# Patient Record
Sex: Female | Born: 1975
Health system: Southern US, Community
[De-identification: ages and names within clinical notes are randomized; demographics above are authoritative.]

## PROBLEM LIST (undated history)

## (undated) DIAGNOSIS — R3915 Urgency of urination: Secondary | ICD-10-CM

## (undated) DIAGNOSIS — R0981 Nasal congestion: Secondary | ICD-10-CM

## (undated) DIAGNOSIS — Z803 Family history of malignant neoplasm of breast: Secondary | ICD-10-CM

## (undated) DIAGNOSIS — R0982 Postnasal drip: Secondary | ICD-10-CM

## (undated) DIAGNOSIS — C50411 Malignant neoplasm of upper-outer quadrant of right female breast: Secondary | ICD-10-CM

## (undated) DIAGNOSIS — F419 Anxiety disorder, unspecified: Secondary | ICD-10-CM

## (undated) HISTORY — PX: GYNECOLOGIC CRYOSURGERY: SHX857

## (undated) HISTORY — DX: Family history of malignant neoplasm of breast: Z80.3

## (undated) HISTORY — PX: WISDOM TOOTH EXTRACTION: SHX21

## (undated) HISTORY — DX: Anxiety disorder, unspecified: F41.9

## (undated) HISTORY — DX: Malignant neoplasm of upper-outer quadrant of right female breast: C50.411

## (undated) HISTORY — PX: LEEP: SHX91

---

## 2010-09-26 ENCOUNTER — Inpatient Hospital Stay (HOSPITAL_COMMUNITY)
Admission: AD | Admit: 2010-09-26 | Discharge: 2010-09-26 | Disposition: A | Discharge status: Home / Self Care | Payer: Medicaid Other | Source: Ambulatory Visit | Attending: Obstetrics & Gynecology | Admitting: Obstetrics & Gynecology

## 2010-09-26 ENCOUNTER — Encounter (HOSPITAL_COMMUNITY): Payer: Self-pay | Admitting: *Deleted

## 2010-09-26 DIAGNOSIS — R42 Dizziness and giddiness: Secondary | ICD-10-CM | POA: Insufficient documentation

## 2010-09-26 DIAGNOSIS — R55 Syncope and collapse: Secondary | ICD-10-CM

## 2010-09-26 DIAGNOSIS — O99891 Other specified diseases and conditions complicating pregnancy: Secondary | ICD-10-CM | POA: Insufficient documentation

## 2010-09-26 LAB — URINALYSIS, ROUTINE W REFLEX MICROSCOPIC
Glucose, UA: NEGATIVE mg/dL
Leukocytes, UA: NEGATIVE
Protein, ur: 30 mg/dL — AB
Urobilinogen, UA: 0.2 mg/dL (ref 0.0–1.0)

## 2010-09-26 LAB — URINE MICROSCOPIC-ADD ON

## 2010-09-26 NOTE — Progress Notes (Signed)
Pt in via ems for weakness, lightheadedness, and dizziness.  States the episode occurred around 1045.  States dizziness has been occurring throughout pregnancy but has gotten more frequent past 3 weeks.  Was seen in office Friday, told MD, had blood drawn.  Denies any bleeding, discharge, or leaking of fluid.  + FM.  Presently feels tired and weak.

## 2010-09-26 NOTE — ED Provider Notes (Addendum)
History   Pt presents today via EMS c/o feeling like she was going to pass out. She states she got out of her car and walked to class and sat down. At that time she felt very hot, began to sweat, and felt like she was going to pass out. She was able to get to the floor where she began to feel better but she called EMS so that she could be evaluated. She denies lower abd pain, vag dc, bleeding, fever, or any other sx at this time.  Chief Complaint  Patient presents with  . Fatigue    dizziness and lightheaded   HPI  OB History    Grav Para Term Preterm Abortions TAB SAB Ect Mult Living   2 1 1  0 0 0 0 0 0 1      Past Medical History  Diagnosis Date  . No pertinent past medical history     Past Surgical History  Procedure Date  . Wisdom tooth extraction   . Leep   . Gynecologic cryosurgery     No family history on file.  History  Substance Use Topics  . Smoking status: Never Smoker   . Smokeless tobacco: Not on file  . Alcohol Use: No    Allergies: No Known Allergies  Prescriptions prior to admission  Medication Sig Dispense Refill  . acetaminophen (TYLENOL) 500 MG tablet Take 1,000 mg by mouth every 6 (six) hours as needed. pain       . prenatal vitamin w/FE, FA (PRENATAL 1 + 1) 27-1 MG TABS Take 1 tablet by mouth daily.          Review of Systems  Constitutional: Negative for fever.  Eyes: Negative for double vision and photophobia.  Cardiovascular: Negative for chest pain and palpitations.  Gastrointestinal: Negative for nausea, vomiting, abdominal pain, diarrhea and constipation.  Genitourinary: Negative for dysuria, urgency, frequency and hematuria.  Neurological: Negative for headaches.  Psychiatric/Behavioral: Negative for depression and suicidal ideas.   Physical Exam   Blood pressure 122/79, pulse 83, temperature 97.1 F (36.2 C), temperature source Oral, resp. rate 18, height 5\' 2"  (1.575 m), weight 121 lb (54.885 kg), SpO2 95.00%.  Physical Exam    Constitutional: She appears well-developed and well-nourished. No distress.  HENT:  Head: Normocephalic and atraumatic.  Eyes: EOM are normal. Pupils are equal, round, and reactive to light.  Cardiovascular: Normal rate, regular rhythm and normal heart sounds.  Exam reveals no gallop and no friction rub.   No murmur heard. Respiratory: Effort normal and breath sounds normal. No respiratory distress. She has no wheezes. She has no rales. She exhibits no tenderness.  GI: Soft. She exhibits no distension. There is no tenderness. There is no rebound and no guarding.  Neurological: She is alert.  Skin: Skin is warm and dry. She is not diaphoretic.  Psychiatric: She has a normal mood and affect. Her behavior is normal. Judgment and thought content normal.    MAU Course  Procedures NST reactive for 29wks.  Discussed pt with Dr. Arlyce Dice. Dr. Arlyce Dice advised to give pt precautions and have her f/u in the office.  Assessment and Plan  Vasovagal reaction: discussed with pt at length. Discussed diet, activity, risks, and precautions. She has f/u scheduled.  Clinton Gallant. Sheriden Archibeque III, DrHSc, MPAS, PA-C  09/26/2010, 1:01 PM   Henrietta Hoover, PA 09/26/10 1316  [place], [place] 09/26/10 1316

## 2010-10-02 NOTE — ED Provider Notes (Signed)
Reviewed with EDP.

## 2010-11-24 ENCOUNTER — Ambulatory Visit: Payer: Self-pay | Admitting: Pediatrics

## 2010-11-25 ENCOUNTER — Inpatient Hospital Stay (HOSPITAL_COMMUNITY)
Admission: AD | Admit: 2010-11-25 | Discharge: 2010-11-26 | Disposition: A | Discharge status: Home / Self Care | Payer: Medicaid Other | Source: Ambulatory Visit | Attending: Obstetrics and Gynecology | Admitting: Obstetrics and Gynecology

## 2010-11-25 ENCOUNTER — Encounter (HOSPITAL_COMMUNITY): Payer: Self-pay

## 2010-11-25 DIAGNOSIS — O479 False labor, unspecified: Secondary | ICD-10-CM | POA: Insufficient documentation

## 2010-11-25 NOTE — Progress Notes (Signed)
Patient is here for labor check. States that ctx q61m. Reports good fetal movement. Denies any vaginal bleeding or lof.

## 2010-11-25 NOTE — Progress Notes (Signed)
Dr Claiborne Billings notified of patient, her c/o strong contractions, tracing, ctx pattern, sve result. Order to have her ambulate if she desires and recheck.

## 2010-11-26 ENCOUNTER — Encounter (HOSPITAL_COMMUNITY): Payer: Self-pay

## 2010-11-26 ENCOUNTER — Inpatient Hospital Stay (HOSPITAL_COMMUNITY)
Admission: AD | Admit: 2010-11-26 | Discharge: 2010-11-28 | DRG: 776 | Disposition: A | Discharge status: Home / Self Care | Payer: Medicaid Other | Source: Ambulatory Visit | Attending: Obstetrics and Gynecology | Admitting: Obstetrics and Gynecology

## 2010-11-26 DIAGNOSIS — Z3480 Encounter for supervision of other normal pregnancy, unspecified trimester: Secondary | ICD-10-CM

## 2010-11-26 MED ORDER — DIPHENHYDRAMINE HCL 25 MG PO CAPS: 25.0000 mg | ORAL_CAPSULE | Freq: Four times a day (QID) | ORAL | Status: DC | PRN

## 2010-11-26 MED ORDER — ONDANSETRON HCL 4 MG/2ML IJ SOLN: 4.0000 mg | INTRAMUSCULAR | Status: DC | PRN

## 2010-11-26 MED ORDER — ZOLPIDEM TARTRATE 5 MG PO TABS: 5.0000 mg | ORAL_TABLET | Freq: Every evening | ORAL | Status: DC | PRN

## 2010-11-26 MED ORDER — SODIUM CHLORIDE 0.9 % IV SOLN
INTRAVENOUS | Status: DC
  Administered 2010-11-26: 07:00:00 via INTRAVENOUS

## 2010-11-26 MED ORDER — BENZOCAINE-MENTHOL 20-0.5 % EX AERO: 1.0000 "application " | INHALATION_SPRAY | CUTANEOUS | Status: DC | PRN

## 2010-11-26 MED ORDER — WITCH HAZEL-GLYCERIN EX PADS: 1.0000 "application " | MEDICATED_PAD | CUTANEOUS | Status: DC | PRN

## 2010-11-26 MED ORDER — ONDANSETRON HCL 4 MG PO TABS: 4.0000 mg | ORAL_TABLET | ORAL | Status: DC | PRN

## 2010-11-26 MED ORDER — IBUPROFEN 600 MG PO TABS
600.0000 mg | ORAL_TABLET | Freq: Four times a day (QID) | ORAL | Status: DC
  Administered 2010-11-26 – 2010-11-28 (×9): 600 mg via ORAL
  Filled 2010-11-26 (×10): qty 1

## 2010-11-26 MED ORDER — PRENATAL PLUS 27-1 MG PO TABS
1.0000 | ORAL_TABLET | Freq: Every day | ORAL | Status: DC
  Administered 2010-11-27 – 2010-11-28 (×2): 1 via ORAL
  Filled 2010-11-26 (×3): qty 1

## 2010-11-26 MED ORDER — OXYCODONE-ACETAMINOPHEN 5-325 MG PO TABS
1.0000 | ORAL_TABLET | ORAL | Status: DC | PRN
  Administered 2010-11-26 (×2): 1 via ORAL
  Administered 2010-11-27: 2 via ORAL
  Administered 2010-11-27 – 2010-11-28 (×2): 1 via ORAL
  Filled 2010-11-26: qty 2
  Filled 2010-11-26 (×4): qty 1

## 2010-11-26 MED ORDER — SIMETHICONE 80 MG PO CHEW: 80.0000 mg | CHEWABLE_TABLET | ORAL | Status: DC | PRN

## 2010-11-26 MED ORDER — BENZOCAINE-MENTHOL 20-0.5 % EX AERO
INHALATION_SPRAY | CUTANEOUS | Status: AC
  Filled 2010-11-26: qty 56

## 2010-11-26 MED ORDER — OXYTOCIN 20 UNITS IN LACTATED RINGERS INFUSION - SIMPLE
125.0000 mL/h | INTRAVENOUS | Status: DC
  Administered 2010-11-26: 125 mL/h via INTRAVENOUS
  Filled 2010-11-26: qty 1000

## 2010-11-26 MED ORDER — TETANUS-DIPHTH-ACELL PERTUSSIS 5-2.5-18.5 LF-MCG/0.5 IM SUSP
0.5000 mL | Freq: Once | INTRAMUSCULAR | Status: AC
  Administered 2010-11-27: 0.5 mL via INTRAMUSCULAR
  Filled 2010-11-26: qty 0.5

## 2010-11-26 MED ORDER — SENNOSIDES-DOCUSATE SODIUM 8.6-50 MG PO TABS
2.0000 | ORAL_TABLET | Freq: Every day | ORAL | Status: DC
  Administered 2010-11-26 – 2010-11-27 (×2): 2 via ORAL

## 2010-11-26 MED ORDER — ZOLPIDEM TARTRATE 5 MG PO TABS
5.0000 mg | ORAL_TABLET | Freq: Once | ORAL | Status: AC
  Administered 2010-11-26: 5 mg via ORAL
  Filled 2010-11-26: qty 1

## 2010-11-26 MED ORDER — INFLUENZA VIRUS VACC SPLIT PF IM SUSP
0.5000 mL | INTRAMUSCULAR | Status: AC
  Administered 2010-11-27: 0.5 mL via INTRAMUSCULAR
  Filled 2010-11-26: qty 0.5

## 2010-11-26 MED ORDER — LANOLIN HYDROUS EX OINT: TOPICAL_OINTMENT | CUTANEOUS | Status: DC | PRN

## 2010-11-26 MED ORDER — DIBUCAINE 1 % RE OINT: 1.0000 "application " | TOPICAL_OINTMENT | RECTAL | Status: DC | PRN

## 2010-11-26 NOTE — H&P (Addendum)
35 y.o.  G2P2001 comes in by EMS having delivered at home.  Pt was assessed earlier in the evening and found to be 2.5 cm, pt was monitored and walked, was rechecked and remained 2/90/-1.  Pt presented several hours later having delivered at home with placenta undelivered.  Pt was examined and found to have a small 1 degree tear which was repaired with 2-0 vicryl after placement of lidocaine.  Placenta delivered intact.  Minimal bleeding noted thereafter.    Past Medical History  Diagnosis Date  . Abnormal Pap smear     Past Surgical History  Procedure Date  . Wisdom tooth extraction   . Leep   . Gynecologic cryosurgery     OB History    Grav Para Term Preterm Abortions TAB SAB Ect Mult Living   2 2 2  0 0 0 0 0 1 1     # Outc Date GA Lbr Len/2nd Wgt Sex Del Anes PTL Lv   1A TRM  [redacted]w[redacted]d 00:00         1B  11/12 [redacted]w[redacted]d 00:00 5lb13.2oz(2.643kg) F       2 TRM               History   Social History  . Marital Status: Single    Spouse Name: N/A    Number of Children: N/A  . Years of Education: N/A   Occupational History  . Not on file.   Social History Main Topics  . Smoking status: Never Smoker   . Smokeless tobacco: Not on file  . Alcohol Use: No  . Drug Use: No  . Sexually Active: Yes    Birth Control/ Protection: None   Other Topics Concern  . Not on file   Social History Narrative  . No narrative on file   Review of patient's allergies indicates no known allergies.   Prenatal Course:  H/o cryo/LEEP, former smoker, former use of Marijuana, late to prenatal care  There were no vitals filed for this visit.   Lungs/Cor:  NAD Abdomen:  Soft, NT, FF Ex:  no cords, erythema SVE:  As above   A/P   PPD 0 Admit for postpartum care, baby doing well, being observed in NICU Routine pp care, pp pitocin started in MAU   GBS Neg  Kayal Mula

## 2010-11-26 NOTE — Progress Notes (Signed)
Dr Claiborne Billings updated of tracing, ctx pattern, sve result, patient being very uncomfortable (asking for pain medicine). Order received to discharge, and give ambien 5mg  prior to discharge

## 2010-11-27 ENCOUNTER — Encounter (HOSPITAL_COMMUNITY): Payer: Self-pay | Admitting: *Deleted

## 2010-11-27 LAB — CBC
HCT: 31.2 % — ABNORMAL LOW (ref 36.0–46.0)
Hemoglobin: 10.1 g/dL — ABNORMAL LOW (ref 12.0–15.0)
MCH: 26.6 pg (ref 26.0–34.0)
MCHC: 32.4 g/dL (ref 30.0–36.0)
MCV: 82.3 fL (ref 78.0–100.0)
Platelets: 198 10*3/uL (ref 150–400)
RBC: 3.79 MIL/uL — ABNORMAL LOW (ref 3.87–5.11)
RDW: 14.4 % (ref 11.5–15.5)
WBC: 10.4 10*3/uL (ref 4.0–10.5)

## 2010-11-27 NOTE — Progress Notes (Signed)
Post Partum Day 1, S/P home delivery of 37 week female infant Subjective: no complaints, up ad lib, voiding and tolerating PO  Objective: Blood pressure 108/58, pulse 87, temperature 98.2 F (36.8 C), temperature source Oral, resp. rate 16, SpO2 99.00%, unknown if currently breastfeeding.  Physical Exam:  General: alert, cooperative and appears stated age Lochia: appropriate Uterine Fundus: firm DVT Evaluation: No evidence of DVT seen on physical exam.   Basename 11/27/10 0510  HGB 10.1*  HCT 31.2*    Assessment/Plan: 1) Routine postpartum care 2) Pt and husband wish to stay in the hospital as long as possible for obs of the baby d/t home birth.  Baby doing well and is at bedside with parents.   LOS: 1 day   Alexandria Richards H. 11/27/2010, 9:27 AM

## 2010-11-27 NOTE — Progress Notes (Signed)
UR chart review completed.  

## 2010-11-28 MED ORDER — IBUPROFEN 600 MG PO TABS: 600.0000 mg | ORAL_TABLET | Freq: Four times a day (QID) | ORAL | Status: AC

## 2010-11-28 MED ORDER — OXYCODONE-ACETAMINOPHEN 5-325 MG PO TABS: 1.0000 | ORAL_TABLET | ORAL | Status: AC | PRN

## 2010-11-28 NOTE — Discharge Summary (Signed)
Obstetric Discharge Summary Reason for Admission: onset of labor Prenatal Procedures: ultrasound Intrapartum Procedures: spontaneous vaginal delivery Postpartum Procedures: none Complications-Operative and Postpartum: none Hemoglobin  Date Value Range Status  11/27/2010 10.1* 12.0-15.0 (g/dL) Final     HCT  Date Value Range Status  11/27/2010 31.2* 36.0-46.0 (%) Final    Discharge Diagnoses: Term Pregnancy-delivered  Discharge Information: Date: 11/28/2010 Activity: pelvic rest Diet: routine Medications: PNV, Colace, Iron and Percocet Condition: stable Instructions: refer to practice specific booklet Discharge to: home   Newborn Data:   Makia, Bossi [[id number]]  Live born unspecified sex  Birth Weight:  APGARVihana, Kydd Girl Tarry [[id number]]  Live born female  Birth Weight: 5 lb 13.2 oz (2642 g) APGAR: ,   Home with mother.  ANDERSON,MARK E 11/28/2010, 10:03 AM

## 2010-11-28 NOTE — Progress Notes (Signed)
PPD#2 Pt c/o constipation.  Lochia-wnl IMP/ constipation PLAN/ Dulcolax supp, will d/c home after BM.

## 2011-01-05 ENCOUNTER — Inpatient Hospital Stay (HOSPITAL_COMMUNITY)
Admission: AD | Admit: 2011-01-05 | Discharge: 2011-01-05 | Disposition: A | Discharge status: Home / Self Care | Payer: Medicaid Other | Source: Ambulatory Visit | Attending: Obstetrics and Gynecology | Admitting: Obstetrics and Gynecology

## 2011-01-05 DIAGNOSIS — A64 Unspecified sexually transmitted disease: Secondary | ICD-10-CM | POA: Insufficient documentation

## 2011-01-05 MED ORDER — CEFTRIAXONE SODIUM 250 MG IJ SOLR
250.0000 mg | Freq: Once | INTRAMUSCULAR | Status: AC
  Administered 2011-01-05: 250 mg via INTRAMUSCULAR
  Filled 2011-01-05: qty 250

## 2011-08-25 ENCOUNTER — Encounter (HOSPITAL_COMMUNITY): Payer: Self-pay | Admitting: *Deleted

## 2011-08-25 ENCOUNTER — Emergency Department (HOSPITAL_COMMUNITY)
Admission: EM | Admit: 2011-08-25 | Discharge: 2011-08-25 | Disposition: A | Discharge status: Home / Self Care | Payer: No Typology Code available for payment source | Attending: Emergency Medicine | Admitting: Emergency Medicine

## 2011-08-25 DIAGNOSIS — M545 Low back pain, unspecified: Secondary | ICD-10-CM

## 2011-08-25 MED ORDER — ACETAMINOPHEN 325 MG PO TABS
650.0000 mg | ORAL_TABLET | Freq: Once | ORAL | Status: AC
  Administered 2011-08-25: 650 mg via ORAL
  Filled 2011-08-25: qty 2

## 2011-08-25 MED ORDER — ONDANSETRON 8 MG PO TBDP
8.0000 mg | ORAL_TABLET | Freq: Once | ORAL | Status: AC
Start: 2011-08-25 — End: 2011-08-25
  Administered 2011-08-25: 8 mg via ORAL
  Filled 2011-08-25: qty 1
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
## 2011-09-20 ENCOUNTER — Ambulatory Visit: Payer: 59 | Attending: Family Medicine

## 2011-09-20 DIAGNOSIS — R5381 Other malaise: Secondary | ICD-10-CM | POA: Insufficient documentation

## 2011-09-20 DIAGNOSIS — IMO0001 Reserved for inherently not codable concepts without codable children: Secondary | ICD-10-CM | POA: Insufficient documentation

## 2011-09-20 DIAGNOSIS — R293 Abnormal posture: Secondary | ICD-10-CM | POA: Insufficient documentation

## 2011-09-20 DIAGNOSIS — M2550 Pain in unspecified joint: Secondary | ICD-10-CM | POA: Insufficient documentation

## 2011-09-20 DIAGNOSIS — M2560 Stiffness of unspecified joint, not elsewhere classified: Secondary | ICD-10-CM | POA: Insufficient documentation

## 2011-10-01 ENCOUNTER — Ambulatory Visit: Payer: 59

## 2011-10-04 ENCOUNTER — Ambulatory Visit: Payer: 59

## 2011-10-09 ENCOUNTER — Ambulatory Visit: Payer: 59

## 2011-10-11 ENCOUNTER — Ambulatory Visit: Payer: 59

## 2011-10-18 ENCOUNTER — Ambulatory Visit: Payer: 59 | Attending: Family Medicine

## 2011-10-18 DIAGNOSIS — IMO0001 Reserved for inherently not codable concepts without codable children: Secondary | ICD-10-CM | POA: Insufficient documentation

## 2011-10-18 DIAGNOSIS — M2550 Pain in unspecified joint: Secondary | ICD-10-CM | POA: Insufficient documentation

## 2011-10-18 DIAGNOSIS — M2560 Stiffness of unspecified joint, not elsewhere classified: Secondary | ICD-10-CM | POA: Insufficient documentation

## 2011-10-18 DIAGNOSIS — R5381 Other malaise: Secondary | ICD-10-CM | POA: Insufficient documentation

## 2011-10-18 DIAGNOSIS — R293 Abnormal posture: Secondary | ICD-10-CM | POA: Insufficient documentation

## 2011-10-23 ENCOUNTER — Ambulatory Visit: Payer: 59

## 2011-10-25 ENCOUNTER — Ambulatory Visit: Payer: 59

## 2011-11-06 ENCOUNTER — Ambulatory Visit: Payer: 59

## 2011-11-08 ENCOUNTER — Ambulatory Visit: Payer: 59

## 2011-11-13 ENCOUNTER — Ambulatory Visit: Payer: 59

## 2011-11-22 ENCOUNTER — Ambulatory Visit: Payer: No Typology Code available for payment source | Attending: Family Medicine

## 2011-11-22 DIAGNOSIS — R5381 Other malaise: Secondary | ICD-10-CM | POA: Insufficient documentation

## 2011-11-22 DIAGNOSIS — M2550 Pain in unspecified joint: Secondary | ICD-10-CM | POA: Insufficient documentation

## 2011-11-22 DIAGNOSIS — M2560 Stiffness of unspecified joint, not elsewhere classified: Secondary | ICD-10-CM | POA: Insufficient documentation

## 2011-11-22 DIAGNOSIS — IMO0001 Reserved for inherently not codable concepts without codable children: Secondary | ICD-10-CM | POA: Insufficient documentation

## 2011-11-22 DIAGNOSIS — R293 Abnormal posture: Secondary | ICD-10-CM | POA: Insufficient documentation

## 2011-11-27 ENCOUNTER — Ambulatory Visit: Payer: No Typology Code available for payment source

## 2011-11-29 ENCOUNTER — Ambulatory Visit: Payer: No Typology Code available for payment source

## 2012-01-16 NOTE — L&D Delivery Note (Signed)
Delivery Note At 4:52 PM a viable and healthy female was delivered via Vaginal, Spontaneous Delivery (Presentation: Right Occiput Anterior).  APGAR: 9, 9; weight .   Placenta status: Intact, Spontaneous.  Cord: 3 vessels   Anesthesia: 10cc 1 % lidocaine Episiotomy: None Lacerations: 1st degree;Perineal Suture Repair: 3.0 vicryl Est. Blood Loss (mL): 250 cc  Mom to postpartum.  Baby to nursery-stable.  Rushi Chasen H. 08/14/2012, 5:25 PM

## 2012-04-22 ENCOUNTER — Emergency Department (HOSPITAL_COMMUNITY)
Admission: EM | Admit: 2012-04-22 | Discharge: 2012-04-22 | Disposition: A | Discharge status: Home / Self Care | Payer: 59 | Attending: Emergency Medicine | Admitting: Emergency Medicine

## 2012-04-22 ENCOUNTER — Encounter (HOSPITAL_COMMUNITY): Payer: Self-pay | Admitting: *Deleted

## 2012-04-22 ENCOUNTER — Emergency Department (HOSPITAL_COMMUNITY): Payer: 59

## 2012-04-22 DIAGNOSIS — R197 Diarrhea, unspecified: Secondary | ICD-10-CM | POA: Insufficient documentation

## 2012-04-22 DIAGNOSIS — R1110 Vomiting, unspecified: Secondary | ICD-10-CM

## 2012-04-22 DIAGNOSIS — M542 Cervicalgia: Secondary | ICD-10-CM | POA: Insufficient documentation

## 2012-04-22 DIAGNOSIS — R51 Headache: Secondary | ICD-10-CM | POA: Insufficient documentation

## 2012-04-22 DIAGNOSIS — O210 Mild hyperemesis gravidarum: Secondary | ICD-10-CM | POA: Insufficient documentation

## 2012-04-22 DIAGNOSIS — O9989 Other specified diseases and conditions complicating pregnancy, childbirth and the puerperium: Secondary | ICD-10-CM | POA: Insufficient documentation

## 2012-04-22 DIAGNOSIS — H53149 Visual discomfort, unspecified: Secondary | ICD-10-CM | POA: Insufficient documentation

## 2012-04-22 MED ORDER — SODIUM CHLORIDE 0.9 % IV BOLUS (SEPSIS)
500.0000 mL | Freq: Once | INTRAVENOUS | Status: AC
  Administered 2012-04-22: 1000 mL via INTRAVENOUS

## 2012-04-22 MED ORDER — ONDANSETRON 4 MG PO TBDP: 4.0000 mg | ORAL_TABLET | Freq: Three times a day (TID) | ORAL | Status: DC | PRN

## 2012-04-22 MED ORDER — SODIUM CHLORIDE 0.9 % IV BOLUS (SEPSIS)
500.0000 mL | Freq: Once | INTRAVENOUS | Status: AC
  Administered 2012-04-22: 500 mL via INTRAVENOUS

## 2012-04-22 MED ORDER — MECLIZINE HCL 25 MG PO TABS
50.0000 mg | ORAL_TABLET | Freq: Once | ORAL | Status: AC
  Administered 2012-04-22: 50 mg via ORAL
  Filled 2012-04-22: qty 2

## 2012-04-22 MED ORDER — SODIUM CHLORIDE 0.9 % IV SOLN: INTRAVENOUS | Status: DC

## 2012-04-22 MED ORDER — ONDANSETRON HCL 4 MG/2ML IJ SOLN
4.0000 mg | Freq: Once | INTRAMUSCULAR | Status: AC
  Administered 2012-04-22: 4 mg via INTRAVENOUS
  Filled 2012-04-22: qty 2

## 2012-04-22 MED ORDER — ONDANSETRON 4 MG PO TBDP
8.0000 mg | ORAL_TABLET | Freq: Once | ORAL | Status: AC
  Administered 2012-04-22: 8 mg via ORAL
  Filled 2012-04-22: qty 2

## 2012-04-22 MED ORDER — ACETAMINOPHEN 325 MG PO TABS
650.0000 mg | ORAL_TABLET | Freq: Once | ORAL | Status: AC
  Administered 2012-04-22: 650 mg via ORAL
  Filled 2012-04-22: qty 2

## 2012-04-22 NOTE — ED Provider Notes (Signed)
History     CSN: [id number]  Arrival date & time 04/22/12  0904   First MD Initiated Contact with Patient 04/22/12 [phone]      Chief Complaint  Patient presents with  . Nausea  . Emesis    (Consider location/radiation/quality/duration/timing/severity/associated sxs/prior treatment) HPI Comments: Patient who is [redacted] wks pregnant, no previous prenatal care -- presents with complaint of vomiting which started approximately one hour ago. Patient was in emergency department with her husband who has had several hours of nausea, vomiting, diarrhea. While in the emergency department, patient started vomiting. During the second round of vomiting the patient developed an acute onset, 9/10, frontal headache, associated with photophobia, and radiates to her occiput. She states she had mild neck pain which has improved. Patient has history of headaches which are usually in her temples. She has not had headaches like this before. She denies vision change or vision loss. She denies weakness, numbness, or tingling in her arms or her legs. No treatments prior to arrival. Onset of symptoms acute. Course is constant. Nothing makes symptoms better or worse.  The history is provided by the patient.    Past Medical History  Diagnosis Date  . Abnormal Pap smear   . Post partum depression     Past Surgical History  Procedure Laterality Date  . Wisdom tooth extraction    . Leep    . Gynecologic cryosurgery      No family history on file.  History  Substance Use Topics  . Smoking status: Never Smoker   . Smokeless tobacco: Not on file  . Alcohol Use: No    OB History   Grav Para Term Preterm Abortions TAB SAB Ect Mult Living   3 2 2  0 0 0 0 0 1 2      Review of Systems  Constitutional: Negative for fever.  HENT: Positive for neck pain (improved). Negative for congestion, rhinorrhea, neck stiffness, dental problem and sinus pressure.   Eyes: Positive for photophobia. Negative for discharge,  redness and visual disturbance.  Respiratory: Negative for shortness of breath.   Cardiovascular: Negative for chest pain.  Gastrointestinal: Positive for nausea and vomiting.  Musculoskeletal: Negative for gait problem.  Skin: Negative for rash.  Neurological: Positive for headaches. Negative for syncope, speech difficulty, weakness, light-headedness and numbness.  Psychiatric/Behavioral: Negative for confusion.    Allergies  Review of patient's allergies indicates no known allergies.  Home Medications   Current Outpatient Rx  Name  Route  Sig  Dispense  Refill  . cyclobenzaprine (FLEXERIL) 10 MG tablet   Oral   Take 10 mg by mouth daily as needed for muscle spasms.         Marland Kitchen escitalopram (LEXAPRO) 10 MG tablet   Oral   Take 10 mg by mouth daily.         Marland Kitchen HYDROcodone-acetaminophen (NORCO/VICODIN) 5-325 MG per tablet   Oral   Take 0.5-1 tablets by mouth daily as needed for pain.         . Prenatal Vit-Fe Fumarate-FA (MULTIVITAMIN-PRENATAL) 27-0.8 MG TABS   Oral   Take 1 tablet by mouth daily at 12 noon.         . ondansetron (ZOFRAN ODT) 4 MG disintegrating tablet   Oral   Take 1 tablet (4 mg total) by mouth every 8 (eight) hours as needed for nausea.   10 tablet   0     BP 107/75  Pulse 87  Temp(Src) 97.8 F (36.6 C) (  Oral)  Ht 5\' 2"  (1.575 m)  Wt 111 lb (50.349 kg)  BMI 20.3 kg/m2  SpO2 100%  LMP 03/08/2012  Physical Exam  Nursing note and vitals reviewed. Constitutional: She is oriented to person, place, and time. She appears well-developed and well-nourished.  HENT:  Head: Normocephalic and atraumatic.  Right Ear: Tympanic membrane, external ear and ear canal normal.  Left Ear: Tympanic membrane, external ear and ear canal normal.  Nose: Nose normal.  Mouth/Throat: Uvula is midline, oropharynx is clear and moist and mucous membranes are normal.  Eyes: Conjunctivae, EOM and lids are normal. Pupils are equal, round, and reactive to light. Right  eye exhibits no nystagmus. Left eye exhibits no nystagmus.  Neck: Normal range of motion. Neck supple.  No meningismus  Cardiovascular: Normal rate and regular rhythm.   Pulmonary/Chest: Effort normal and breath sounds normal.  Abdominal: Soft. There is no tenderness.  Musculoskeletal:       Cervical back: She exhibits normal range of motion, no tenderness and no bony tenderness.  Neurological: She is alert and oriented to person, place, and time. She has normal strength and normal reflexes. No cranial nerve deficit or sensory deficit. She displays a negative Romberg sign. Coordination and gait normal. GCS eye subscore is 4. GCS verbal subscore is 5. GCS motor subscore is 6.  Skin: Skin is warm and dry.  Psychiatric: She has a normal mood and affect.    ED Course  Procedures (including critical care time)  Labs Reviewed - No data to display Ct Head Wo Contrast  04/22/2012  *RADIOLOGY REPORT*  Clinical Data: Severe headache, vomiting  CT HEAD WITHOUT CONTRAST  Technique:  Contiguous axial images were obtained from the base of the skull through the vertex without contrast. The patient was shielded for pregnancy.  Comparison: None.  Findings: No mass effect or midline shift is noted.  Ventricular size is within normal limits.  There is no evidence of mass lesion, hemorrhage or acute infarction.  IMPRESSION: No gross intracranial abnormality seen.   Original Report Authenticated By: Lupita Raider.,  M.D.      1. Vomiting   2. Headache     9:58 AM Patient seen and examined. Work-up initiated. D/w Dr. Rubin Payor. Will need head CT given acute nature of HA while vomiting.   Vital signs reviewed and are as follows: Filed Vitals:   04/22/12 0928  BP: 107/75  Pulse: 87  Temp: 97.8 F (36.6 C)   11:12 AM CT images reviewed by myself. Patient is feeling slightly better. States room is 'spinning'. Will give meclizine and additional fluids. D/w Dr. Rubin Payor who will see.   12:12 PM HA to  5/10. Dr. Rubin Payor has seen. Patient refuses LP. Dr. Rubin Payor spoke with neuro radiologist to discuss utility of MRI and it's felt not necessary at this time.  I discussed with the patient the need to return with worsening headache, confusion, uncontrolled vomiting, change in vision, trouble walking or weakness. I discussed the limitations of CT scan and that LP is sometimes necessary to rule out bleeding. She states she does not want this at this time.   Patient also urged to return if headache is not improved in 24 hours.  Patient verbalizes understanding and agrees with this plan. She states she is comfortable with this plan and return if she feels worse.  Tylenol ordered for headache. Will discharge to home with Zofran. Patient appears improved. No focal neurological deficits.  The patient was urged to return to  the Emergency Department immediately with worsening of current symptoms, worsening abdominal pain, persistent vomiting, blood noted in stools, fever, or any other concerns. The patient verbalized understanding.     MDM  Patient with severe onset HA after vomiting. CT neg. Pt refuses LP. Risks and benefits of CT/LP discussed with patient. Return instructions given. Overall, feel this patient is low risk for having SAH. Pain improving without analgesia. She has no focal neuro deficits at onset and had not developed any during 3+ hr ED stay. She appears more comfortable at discharge.   Vomiting. Likely related to GI illness. Pt's husband was in ED with N/V/D at time her vomiting began. Patient with symptoms consistent with viral gastroenteritis.  Vitals are stable, no fever.  No signs of dehydration, tolerating PO's.  Lungs are clear.  No focal abdominal pain, no concern for appendicitis, cholecystitis, pancreatitis, ruptured viscus, UTI, kidney stone, or any other abdominal etiology.  Supportive therapy indicated with return if symptoms worsen.  Patient counseled.         Renne Crigler, PA-C 04/22/12 1222

## 2012-04-22 NOTE — ED Notes (Signed)
Pt here with other family members seen in the ED presenting with N/V.  Pt advises her N/V started at 8 am.  Pt [redacted] weeks pregnant.

## 2012-04-22 NOTE — ED Notes (Signed)
Oob to the bathroom with assist of 1. Pt. Is dizzy, medicated with medication will reassess.Marland Kitchen

## 2012-04-22 NOTE — ED Notes (Signed)
PATIENT HAS ARRIVED TO AWAIT DISCHARGE HOME

## 2012-04-22 NOTE — ED Provider Notes (Signed)
Medical screening examination/treatment/procedure(s) were performed by non-physician practitioner and as supervising physician I was immediately available for consultation/collaboration.  Jerel Sardina R. Arlyn Bumpus, MD 04/22/12 1545 

## 2012-08-14 ENCOUNTER — Encounter (HOSPITAL_COMMUNITY): Payer: Self-pay | Admitting: *Deleted

## 2012-08-14 ENCOUNTER — Inpatient Hospital Stay (HOSPITAL_COMMUNITY)
Admission: AD | Admit: 2012-08-14 | Discharge: 2012-08-16 | DRG: 775 | Disposition: A | Discharge status: Home / Self Care | Payer: 59 | Source: Ambulatory Visit | Attending: Obstetrics and Gynecology | Admitting: Obstetrics and Gynecology

## 2012-08-14 DIAGNOSIS — O09529 Supervision of elderly multigravida, unspecified trimester: Secondary | ICD-10-CM | POA: Diagnosis present

## 2012-08-14 MED ORDER — OXYTOCIN 10 UNIT/ML IJ SOLN
20.0000 [IU] | Freq: Once | INTRAMUSCULAR | Status: AC
  Administered 2012-08-14: 20 [IU] via INTRAMUSCULAR

## 2012-08-14 MED ORDER — OXYTOCIN 40 UNITS IN LACTATED RINGERS INFUSION - SIMPLE MED
INTRAVENOUS | Status: AC
  Filled 2012-08-14: qty 1000

## 2012-08-14 MED ORDER — PRENATAL MULTIVITAMIN CH
1.0000 | ORAL_TABLET | Freq: Every day | ORAL | Status: DC
  Administered 2012-08-15 – 2012-08-16 (×2): 1 via ORAL
  Filled 2012-08-14 (×2): qty 1

## 2012-08-14 MED ORDER — SIMETHICONE 80 MG PO CHEW: 80.0000 mg | CHEWABLE_TABLET | ORAL | Status: DC | PRN

## 2012-08-14 MED ORDER — BENZOCAINE-MENTHOL 20-0.5 % EX AERO
1.0000 "application " | INHALATION_SPRAY | CUTANEOUS | Status: DC | PRN
  Filled 2012-08-14: qty 56

## 2012-08-14 MED ORDER — DIPHENHYDRAMINE HCL 25 MG PO CAPS: 25.0000 mg | ORAL_CAPSULE | Freq: Four times a day (QID) | ORAL | Status: DC | PRN

## 2012-08-14 MED ORDER — TETANUS-DIPHTH-ACELL PERTUSSIS 5-2.5-18.5 LF-MCG/0.5 IM SUSP
0.5000 mL | Freq: Once | INTRAMUSCULAR | Status: AC
  Administered 2012-08-16: 0.5 mL via INTRAMUSCULAR

## 2012-08-14 MED ORDER — METHYLERGONOVINE MALEATE 0.2 MG/ML IJ SOLN: 0.2000 mg | INTRAMUSCULAR | Status: DC | PRN

## 2012-08-14 MED ORDER — IBUPROFEN 600 MG PO TABS
600.0000 mg | ORAL_TABLET | Freq: Four times a day (QID) | ORAL | Status: DC
  Administered 2012-08-14 – 2012-08-16 (×8): 600 mg via ORAL
  Filled 2012-08-14 (×8): qty 1

## 2012-08-14 MED ORDER — LIDOCAINE HCL (PF) 1 % IJ SOLN
INTRAMUSCULAR | Status: AC
  Filled 2012-08-14: qty 30

## 2012-08-14 MED ORDER — ONDANSETRON HCL 4 MG/2ML IJ SOLN: 4.0000 mg | INTRAMUSCULAR | Status: DC | PRN

## 2012-08-14 MED ORDER — LANOLIN HYDROUS EX OINT: TOPICAL_OINTMENT | CUTANEOUS | Status: DC | PRN

## 2012-08-14 MED ORDER — ONDANSETRON HCL 4 MG PO TABS: 4.0000 mg | ORAL_TABLET | ORAL | Status: DC | PRN

## 2012-08-14 MED ORDER — DIBUCAINE 1 % RE OINT: 1.0000 "application " | TOPICAL_OINTMENT | RECTAL | Status: DC | PRN

## 2012-08-14 MED ORDER — WITCH HAZEL-GLYCERIN EX PADS: 1.0000 "application " | MEDICATED_PAD | CUTANEOUS | Status: DC | PRN

## 2012-08-14 MED ORDER — METHYLERGONOVINE MALEATE 0.2 MG PO TABS
0.2000 mg | ORAL_TABLET | ORAL | Status: DC | PRN
Start: 2012-08-14 — End: 2012-08-16

## 2012-08-14 MED ORDER — ZOLPIDEM TARTRATE 5 MG PO TABS: 5.0000 mg | ORAL_TABLET | Freq: Every evening | ORAL | Status: DC | PRN

## 2012-08-14 MED ORDER — SENNOSIDES-DOCUSATE SODIUM 8.6-50 MG PO TABS
2.0000 | ORAL_TABLET | Freq: Every day | ORAL | Status: DC
  Administered 2012-08-15: 2 via ORAL

## 2012-08-14 MED ORDER — OXYTOCIN 10 UNIT/ML IJ SOLN
INTRAMUSCULAR | Status: AC
  Filled 2012-08-14: qty 2

## 2012-08-14 MED ORDER — LIDOCAINE HCL (PF) 1 % IJ SOLN
INTRAMUSCULAR | Status: AC
  Administered 2012-08-14: 30 mL
  Filled 2012-08-14: qty 30

## 2012-08-14 MED ORDER — OXYCODONE-ACETAMINOPHEN 5-325 MG PO TABS
1.0000 | ORAL_TABLET | ORAL | Status: DC | PRN
  Administered 2012-08-15 (×4): 1 via ORAL
  Filled 2012-08-14 (×5): qty 1

## 2012-08-14 NOTE — MAU Note (Signed)
contractions started ago.  Having some bleeding, denies placental problems, no leaking.  3rd baby, no problems with preg.  2 prior vag del, went quick with 2nd- del at home- not by choice.

## 2012-08-14 NOTE — H&P (Signed)
Alexandria Richards is a 37 y.o. female presenting for labor  37 yo G3P1102 @ 36+3 presents to MAU and was found to be a Rim with the vertex at 1+ station.  She was immediately transferred to L&D. Upon Arrival to L&D I arrived and checked the patient and she was completely dilated.  Amniotomy was performed for clear fluid and the patient delivered a vigorous female infant soon thereafter History OB History   Grav Para Term Preterm Abortions TAB SAB Ect Mult Living   3 2 2  0 0 0 0 0 1 2     Past Medical History  Diagnosis Date  . Abnormal Pap smear   . Post partum depression   . Pregnant    Past Surgical History  Procedure Laterality Date  . Wisdom tooth extraction    . Leep    . Gynecologic cryosurgery     Family History: family history is not on file. Social History:  reports that she has never smoked. She has never used smokeless tobacco. She reports that she does not drink alcohol or use illicit drugs.   Prenatal Transfer Tool  Maternal Diabetes: No Genetic Screening: Declined Maternal Ultrasounds/Referrals: Normal Fetal Ultrasounds or other Referrals:  None Maternal Substance Abuse:  No Significant Maternal Medications:  None Significant Maternal Lab Results:  None Other Comments:  None  ROS: as above  Dilation: 10 Station: +1 Exam by:: Dr Tenny Craw Blood pressure 126/77, pulse 69, temperature 97.8 F (36.6 C), temperature source Oral, resp. rate 20, height 5\' 2"  (1.575 m), weight 56.246 kg (124 lb), last menstrual period 03/08/2012. Exam Physical Exam  Prenatal labs: ABO, Rh:  B positive Antibody:  Negative Rubella:  Immune RPR:   NR HBsAg:  Neg  HIV:   NR GBS:   Unknown  Assessment/Plan: 1) Admit 2) No IV access, IM pitocin 20 units administered  Alexandria Richards H. 08/14/2012, 5:18 PM

## 2012-08-15 LAB — TYPE AND SCREEN: ABO/RH(D): B POS

## 2012-08-15 LAB — CBC
Hemoglobin: 12.1 g/dL (ref 12.0–15.0)
MCH: 26.6 pg (ref 26.0–34.0)
MCV: 80 fL (ref 78.0–100.0)
RBC: 4.55 MIL/uL (ref 3.87–5.11)

## 2012-08-15 LAB — RAPID HIV SCREEN (WH-MAU): Rapid HIV Screen: NONREACTIVE

## 2012-08-15 LAB — RPR: RPR Ser Ql: NONREACTIVE

## 2012-08-15 NOTE — Progress Notes (Signed)
Patient is eating, ambulating, voiding.  Pain control is good.  Filed Vitals:   08/14/12 1845 08/14/12 1945 08/15/12 0001 08/15/12 0623  BP: 121/72 122/76 97/73 106/69  Pulse: 69 72 80 72  Temp: 98.2 F (36.8 C) 98.3 F (36.8 C) 98.3 F (36.8 C) 98.3 F (36.8 C)  TempSrc: Oral Oral Oral Oral  Resp: 20 20 18 18   Height:      Weight:      SpO2:    98%    Fundus firm Perineum without swelling.  Lab Results  Component Value Date   WBC 9.3 08/15/2012   HGB 12.1 08/15/2012   HCT 36.4 08/15/2012   MCV 80.0 08/15/2012   PLT 196 08/15/2012    --/--/B POS (08/01 0555)/RI  A/P Post partum day 1.  Routine care.  Expect d/c tomorrow.    Alexandria Richards A

## 2012-08-15 NOTE — Discharge Summary (Signed)
Obstetric Discharge Summary Reason for Admission: onset of labor Prenatal Procedures: none Intrapartum Procedures: spontaneous vaginal delivery Postpartum Procedures: none Complications-Operative and Postpartum: 1 degree perineal laceration Hemoglobin  Date Value Range Status  08/15/2012 12.1  12.0 - 15.0 g/dL Final     HCT  Date Value Range Status  08/15/2012 36.4  36.0 - 46.0 % Final   Discharge Diagnoses: Term Pregnancy-delivered  Discharge Information: Date: 08/15/2012 Activity: pelvic rest Diet: routine Medications: Ibuprofen Condition: stable Instructions: refer to practice specific booklet Discharge to: home Follow-up Information   Follow up with Almon Hercules., MD In 4 weeks.   Contact information:   [address] [place] [address] [phone]       Newborn Data: Live born female  Birth Weight: 5 lb 6.4 oz (2450 g) APGAR: 9, 9  Home with mother.  [name] 08/15/2012, 7:54 AM

## 2012-08-15 NOTE — Progress Notes (Signed)

## 2012-08-16 LAB — RUBELLA SCREEN: Rubella: 6.59 Index — ABNORMAL HIGH (ref <0.90)

## 2012-08-16 NOTE — Progress Notes (Addendum)
Patient is eating, ambulating, voiding.  Pain control is good.  Pt c/o occ room spinning dizziness- she says she has taken the percocet.    Filed Vitals:   08/15/12 0623 08/15/12 0800 08/15/12 1818 08/16/12 0611  BP: 106/69 104/67 102/70 111/72  Pulse: 72 73 82 77  Temp: 98.3 F (36.8 C) 98.5 F (36.9 C) 98.4 F (36.9 C) 98.2 F (36.8 C)  TempSrc: Oral Oral Oral Oral  Resp: 18 18 18 19   Height:      Weight:      SpO2: 98% 99%  98%    Fundus firm Perineum without swelling.  Lab Results  Component Value Date   WBC 9.3 08/15/2012   HGB 12.1 08/15/2012   HCT 36.4 08/15/2012   MCV 80.0 08/15/2012   PLT 196 08/15/2012    --/--/B POS, B POS (08/01 0555)/RI  A/P Post partum day 2.  Orthostatics normal.  H/H excellent- dizziness likely caused by Percocet.  Pt to take motrin only at home.  Increase H2O.  Routine care.  Expect d/c today.    Chance Munter A

## 2012-08-19 NOTE — Progress Notes (Signed)
Post discharge chart review completed.  

## 2012-09-15 ENCOUNTER — Encounter (HOSPITAL_COMMUNITY): Payer: Self-pay | Admitting: *Deleted

## 2012-09-15 ENCOUNTER — Emergency Department (HOSPITAL_COMMUNITY)
Admission: EM | Admit: 2012-09-15 | Discharge: 2012-09-15 | Disposition: A | Discharge status: Home / Self Care | Payer: 59 | Attending: Emergency Medicine | Admitting: Emergency Medicine

## 2012-09-15 DIAGNOSIS — N61 Mastitis without abscess: Secondary | ICD-10-CM

## 2012-09-15 DIAGNOSIS — Z79899 Other long term (current) drug therapy: Secondary | ICD-10-CM | POA: Insufficient documentation

## 2012-09-15 DIAGNOSIS — Z8742 Personal history of other diseases of the female genital tract: Secondary | ICD-10-CM | POA: Insufficient documentation

## 2012-09-15 MED ORDER — CEPHALEXIN 500 MG PO CAPS: 500.0000 mg | ORAL_CAPSULE | Freq: Four times a day (QID) | ORAL | Status: DC

## 2012-09-15 MED ORDER — HYDROCODONE-ACETAMINOPHEN 5-325 MG PO TABS: 0.5000 | ORAL_TABLET | Freq: Every day | ORAL | Status: DC | PRN

## 2012-09-15 NOTE — ED Notes (Signed)
Pt reports left breast/nipple pain for several hours today. States that she hasn't been able to breast feed due to the nipple/breast pain. Pt tearful at this time.

## 2012-09-15 NOTE — ED Provider Notes (Signed)
CSN: [id number]     Arrival date & time 09/15/12  1857 History  This chart was scribed for non-physician practitioner Fayrene Helper, PA-C, working with Ward Givens, MD by Dorothey Baseman, ED Scribe. This patient was seen in room TR08C/TR08C and the patient's care was started at 7:35 PM.    Chief Complaint  Patient presents with  . Breast Pain    The history is provided by the patient. No language interpreter was used.   HPI Comments: Alexandria Richards is a 37 y.o. female who presents to the Emergency Department complaining of a constant, sharp pain in her left breast. She reports that she noticed a small lump beneath her nipple last night and that she applied a nipple cream and has taken 4 Ibuprofen over the past 6 hours without relief. She states she was unable to breast feed her child because the pain is so severe. She reports bloody nipple discharge yesterday that has since improved. She denies any problems to her right breast. She reports associated mild itchiness. She denies any rash, fever, shortness of breath.   Past Medical History  Diagnosis Date  . Abnormal Pap smear   . Post partum depression   . Pregnant    Past Surgical History  Procedure Laterality Date  . Wisdom tooth extraction    . Leep    . Gynecologic cryosurgery     History reviewed. No pertinent family history. History  Substance Use Topics  . Smoking status: Never Smoker   . Smokeless tobacco: Never Used  . Alcohol Use: No   OB History   Grav Para Term Preterm Abortions TAB SAB Ect Mult Living   3 3 2 1  0 0 0 0 1 3     Review of Systems  Constitutional: Negative for fever.  Respiratory: Negative for shortness of breath.   Skin: Negative for rash.  All other systems reviewed and are negative.    Allergies  Review of patient's allergies indicates no known allergies.  Home Medications   Current Outpatient Rx  Name  Route  Sig  Dispense  Refill  . cyclobenzaprine (FLEXERIL) 10 MG tablet   Oral   Take 10 mg  by mouth daily as needed for muscle spasms.         Marland Kitchen escitalopram (LEXAPRO) 10 MG tablet   Oral   Take 10 mg by mouth daily.         Marland Kitchen HYDROcodone-acetaminophen (NORCO/VICODIN) 5-325 MG per tablet   Oral   Take 0.5-1 tablets by mouth daily as needed for pain.         Marland Kitchen ondansetron (ZOFRAN ODT) 4 MG disintegrating tablet   Oral   Take 1 tablet (4 mg total) by mouth every 8 (eight) hours as needed for nausea.   10 tablet   0   . OVER THE COUNTER MEDICATION   Per post-pyloric tube   1 tablet by Per post-pyloric tube route 2 (two) times daily as needed. Patient states she take generic Zantac but does not know what strength.         . Prenatal Vit-Fe Fumarate-FA (MULTIVITAMIN-PRENATAL) 27-0.8 MG TABS   Oral   Take 1 tablet by mouth daily at 12 noon.          Triage Vitals: BP 139/92  Pulse 86  Temp(Src) 98.3 F (36.8 C) (Oral)  Resp 16  SpO2 99%  Physical Exam  Nursing note and vitals reviewed. Constitutional: She is oriented to person, place, and time. She  appears well-developed and well-nourished. No distress.  Patient is tearful, but well-appearing and non-toxic  HENT:  Head: Normocephalic and atraumatic.  Eyes: Conjunctivae are normal.  Neck: Normal range of motion. Neck supple.  Cardiovascular: Normal rate, regular rhythm and normal heart sounds.   Pulmonary/Chest: Effort normal and breath sounds normal. No respiratory distress. Right breast exhibits no tenderness. Left breast exhibits tenderness.    Bilateral breast with normal appearance. No peau d'orange. No rash. No obvious mass. No bloody nipple discharge.   Musculoskeletal: Normal range of motion.  Left breast is tender to palpation.   Neurological: She is alert and oriented to person, place, and time.  Skin: Skin is warm and dry.  Psychiatric: She has a normal mood and affect. Her behavior is normal.    ED Course  Procedures (including critical care time)  DIAGNOSTIC STUDIES: Oxygen Saturation  is 99% on room air, normal by my interpretation.    COORDINATION OF CARE: 7:39PM- Discussed suspicion of an abscess to the area. Advised patient to apply warm compresses to the area. Advised patient to continue breast feeding. Will discharge patient with Keflex and Vicodin to manage pain symptoms. Advised patient to follow up with her OB/GYN if there are any new or worsening symptoms. Discussed treatment plan with patient at bedside and patient verbalized agreement.     Labs Review Labs Reviewed - No data to display Imaging Review No results found.  MDM   1. Mastitis, left, acute    BP 139/92  Pulse 86  Temp(Src) 98.3 F (36.8 C) (Oral)  Resp 16  SpO2 99%   I personally performed the services described in this documentation, which was scribed in my presence. The recorded information has been reviewed and is accurate.     Fayrene Helper, PA-C 09/15/12 1955

## 2012-09-15 NOTE — ED Provider Notes (Signed)
Medical screening examination/treatment/procedure(s) were performed by non-physician practitioner and as supervising physician I was immediately available for consultation/collaboration. Videl Nobrega, MD, FACEP   Lisia Westbay L Anias Bartol, MD 09/15/12 2326 

## 2012-09-15 NOTE — ED Notes (Signed)
Pt states breast feeding and she developed painful nipples and a swollen duct. Pt states painful to touch left breast. Pt breastfeeding for a month now.

## 2013-11-16 ENCOUNTER — Encounter (HOSPITAL_COMMUNITY): Payer: Self-pay | Admitting: *Deleted

## 2014-11-06 ENCOUNTER — Other Ambulatory Visit: Payer: Self-pay | Admitting: Primary Care

## 2014-11-06 DIAGNOSIS — N63 Unspecified lump in unspecified breast: Secondary | ICD-10-CM

## 2014-12-22 ENCOUNTER — Other Ambulatory Visit: Payer: Self-pay | Admitting: Primary Care

## 2014-12-22 DIAGNOSIS — N63 Unspecified lump in unspecified breast: Secondary | ICD-10-CM

## 2014-12-31 ENCOUNTER — Ambulatory Visit
Admission: RE | Admit: 2014-12-31 | Discharge: 2014-12-31 | Disposition: A | Discharge status: Home / Self Care | Payer: Medicaid Other | Source: Ambulatory Visit | Attending: Primary Care | Admitting: Primary Care

## 2014-12-31 ENCOUNTER — Other Ambulatory Visit: Payer: Self-pay | Admitting: Primary Care

## 2014-12-31 DIAGNOSIS — N63 Unspecified lump in unspecified breast: Secondary | ICD-10-CM

## 2015-01-05 ENCOUNTER — Ambulatory Visit
Admission: RE | Admit: 2015-01-05 | Discharge: 2015-01-05 | Disposition: A | Discharge status: Home / Self Care | Payer: Medicaid Other | Source: Ambulatory Visit | Attending: Primary Care | Admitting: Primary Care

## 2015-01-05 ENCOUNTER — Other Ambulatory Visit: Payer: Self-pay | Admitting: Primary Care

## 2015-01-05 DIAGNOSIS — N63 Unspecified lump in unspecified breast: Secondary | ICD-10-CM

## 2015-01-11 ENCOUNTER — Telehealth: Payer: Self-pay | Admitting: *Deleted

## 2015-01-11 NOTE — Telephone Encounter (Signed)
Left vm for pt to return call to schedule [facility] for 01/19/15

## 2015-01-12 ENCOUNTER — Encounter: Payer: Self-pay | Admitting: *Deleted

## 2015-01-12 ENCOUNTER — Telehealth: Payer: Self-pay | Admitting: *Deleted

## 2015-01-12 DIAGNOSIS — C50411 Malignant neoplasm of upper-outer quadrant of right female breast: Secondary | ICD-10-CM

## 2015-01-12 HISTORY — DX: Malignant neoplasm of upper-outer quadrant of right female breast: C50.411

## 2015-01-12 NOTE — Telephone Encounter (Signed)
Mailed clinic packet to pt.  

## 2015-01-12 NOTE — Telephone Encounter (Signed)
Confirmed [facility] for 01/19/15 at 0830 .  Instructions and contact information given.

## 2015-01-19 ENCOUNTER — Encounter: Payer: Self-pay | Admitting: Hematology and Oncology

## 2015-01-19 ENCOUNTER — Ambulatory Visit (HOSPITAL_BASED_OUTPATIENT_CLINIC_OR_DEPARTMENT_OTHER): Payer: Medicaid Other | Admitting: Hematology and Oncology

## 2015-01-19 ENCOUNTER — Encounter: Payer: Self-pay | Admitting: Skilled Nursing Facility1

## 2015-01-19 ENCOUNTER — Encounter: Payer: Self-pay | Admitting: *Deleted

## 2015-01-19 ENCOUNTER — Other Ambulatory Visit (HOSPITAL_BASED_OUTPATIENT_CLINIC_OR_DEPARTMENT_OTHER): Payer: Medicaid Other

## 2015-01-19 ENCOUNTER — Encounter: Payer: Self-pay | Admitting: Nurse Practitioner

## 2015-01-19 ENCOUNTER — Other Ambulatory Visit: Payer: Self-pay | Admitting: *Deleted

## 2015-01-19 ENCOUNTER — Ambulatory Visit
Admission: RE | Admit: 2015-01-19 | Discharge: 2015-01-19 | Disposition: A | Discharge status: Home / Self Care | Payer: Medicaid Other | Source: Ambulatory Visit | Attending: Radiation Oncology | Admitting: Radiation Oncology

## 2015-01-19 ENCOUNTER — Encounter: Payer: Self-pay | Admitting: Physical Therapy

## 2015-01-19 ENCOUNTER — Ambulatory Visit: Payer: Medicaid Other | Attending: General Surgery | Admitting: Physical Therapy

## 2015-01-19 VITALS — BP 111/66 | HR 90 | Temp 98.3°F | Resp 18 | Ht 62.0 in | Wt 112.8 lb

## 2015-01-19 DIAGNOSIS — Z87891 Personal history of nicotine dependence: Secondary | ICD-10-CM

## 2015-01-19 DIAGNOSIS — Z170 Estrogen receptor positive status [ER+]: Secondary | ICD-10-CM | POA: Diagnosis not present

## 2015-01-19 DIAGNOSIS — C50411 Malignant neoplasm of upper-outer quadrant of right female breast: Secondary | ICD-10-CM

## 2015-01-19 DIAGNOSIS — Z803 Family history of malignant neoplasm of breast: Secondary | ICD-10-CM | POA: Diagnosis not present

## 2015-01-19 DIAGNOSIS — R293 Abnormal posture: Secondary | ICD-10-CM | POA: Diagnosis present

## 2015-01-19 LAB — CBC WITH DIFFERENTIAL/PLATELET
BASO%: 0.1 % (ref 0.0–2.0)
Basophils Absolute: 0 10*3/uL (ref 0.0–0.1)
EOS%: 0.6 % (ref 0.0–7.0)
Eosinophils Absolute: 0 10*3/uL (ref 0.0–0.5)
HEMATOCRIT: 40.6 % (ref 34.8–46.6)
HGB: 13.4 g/dL (ref 11.6–15.9)
LYMPH#: 1.4 10*3/uL (ref 0.9–3.3)
LYMPH%: 20.1 % (ref 14.0–49.7)
MCH: 27.2 pg (ref 25.1–34.0)
MCHC: 33 g/dL (ref 31.5–36.0)
MCV: 82.5 fL (ref 79.5–101.0)
MONO#: 0.2 10*3/uL (ref 0.1–0.9)
MONO%: 3.1 % (ref 0.0–14.0)
NEUT#: 5.4 10*3/uL (ref 1.5–6.5)
NEUT%: 76.1 % (ref 38.4–76.8)
Platelets: 246 10*3/uL (ref 145–400)
RBC: 4.92 10*6/uL (ref 3.70–5.45)
RDW: 15 % — AB (ref 11.2–14.5)
WBC: 7.1 10*3/uL (ref 3.9–10.3)

## 2015-01-19 LAB — COMPREHENSIVE METABOLIC PANEL
ALT: 20 U/L (ref 0–55)
AST: 19 U/L (ref 5–34)
Albumin: 3.8 g/dL (ref 3.5–5.0)
Alkaline Phosphatase: 53 U/L (ref 40–150)
Anion Gap: 8 mEq/L (ref 3–11)
BUN: 13 mg/dL (ref 7.0–26.0)
CALCIUM: 9.1 mg/dL (ref 8.4–10.4)
CHLORIDE: 106 meq/L (ref 98–109)
CO2: 25 meq/L (ref 22–29)
CREATININE: 1 mg/dL (ref 0.6–1.1)
EGFR: 86 mL/min/{1.73_m2} — ABNORMAL LOW (ref >90)
GLUCOSE: 114 mg/dL (ref 70–140)
POTASSIUM: 4.3 meq/L (ref 3.5–5.1)
SODIUM: 140 meq/L (ref 136–145)
Total Bilirubin: 0.32 mg/dL (ref 0.20–1.20)
Total Protein: 7.9 g/dL (ref 6.4–8.3)

## 2015-01-19 NOTE — Progress Notes (Signed)
[facility] NOTE  Patient Care Team: Kerin Perna, NP as PCP - General (Internal Medicine) Fanny Skates, MD as Consulting Physician (General Surgery) Nicholas Lose, MD as Consulting Physician (Hematology and Oncology) Kyung Rudd, MD as Consulting Physician (Radiation Oncology)  CHIEF COMPLAINTS/PURPOSE OF CONSULTATION:  Newly diagnosed breast cancer  HISTORY OF PRESENTING ILLNESS:  Alexandria Richards 40 y.o. female is here because of recent diagnosis of Right breast cancer. She had a palpable mass in the right breast and underwent a mammogram that revealed a 1.5 cm lesion with multiple abnormal appearing axillary lymph nodes largest measuring 1.6 cm. 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 The right axillary lymph node biopsy was negative. The tumor was ER/PR positive HER-2 negative with a Ki-67 of 90%. She was presented at the multidisciplinary tumor board and she is here today to discuss a treatment plan.  I reviewed her records extensively and collaborated the history with the patient.  SUMMARY OF ONCOLOGIC HISTORY:   Breast cancer of upper-outer quadrant of right female breast ([place])   12/31/2014 Mammogram Right breast over the palpable mass at 10:30 position: 1.5 x 1.1 x 1.3 cm with multiple abnormal appearing axillary lymph nodes largest 1.6 cm   01/05/2015 Initial Diagnosis Right breast biopsy: IDC grade 2 with DCIS, right axillary lymph node biopsy negative, ER 100%, PR 40%, HER-2 negative, Ki-67 90%    MEDICAL HISTORY:  Past Medical History  Diagnosis Date  . Abnormal Pap smear   . Post partum depression   . Pregnant   . Breast cancer of upper-outer quadrant of right female breast ([place]) 01/12/2015  . Breast cancer ([place])   . GERD (gastroesophageal reflux disease)   . Anxiety     SURGICAL HISTORY: Past Surgical History  Procedure Laterality Date  . Wisdom tooth extraction    . Leep    . Gynecologic  cryosurgery      SOCIAL HISTORY: Social History   Social History  . Marital Status: Single    Spouse Name: N/A  . Number of Children: N/A  . Years of Education: N/A   Occupational History  . Not on file.   Social History Main Topics  . Smoking status: Former Research scientist (life sciences)  . Smokeless tobacco: Never Used  . Alcohol Use: Yes  . Drug Use: Yes  . Sexual Activity: Yes    Birth Control/ Protection: None   Other Topics Concern  . Not on file   Social History Narrative    FAMILY HISTORY: Family History  Problem Relation Age of Onset  . Breast cancer Maternal Aunt     ALLERGIES:  has No Known Allergies.  MEDICATIONS:  Current Outpatient Prescriptions  Medication Sig Dispense Refill  . acetaminophen (TYLENOL) 100 MG/ML solution Take 10 mg/kg by mouth every 4 (four) hours as needed for fever.    Marland Kitchen ibuprofen (ADVIL,MOTRIN) 100 MG tablet Take 100 mg by mouth every 6 (six) hours as needed for fever.    . prazosin (MINIPRESS) 1 MG capsule TAKE 1 CAPSULE BY MOUTH AT BEDTIME FOR NIGHTMARES  1  . cyclobenzaprine (FLEXERIL) 10 MG tablet Take 10 mg by mouth daily as needed for muscle spasms.    Marland Kitchen escitalopram (LEXAPRO) 10 MG tablet Take 10 mg by mouth daily.    Marland Kitchen HYDROcodone-acetaminophen (NORCO/VICODIN) 5-325 MG per tablet Take 0.5-1 tablets by mouth daily as needed for pain. 20 tablet 0  . ondansetron (ZOFRAN ODT) 4 MG disintegrating tablet Take 1  tablet (4 mg total) by mouth every 8 (eight) hours as needed for nausea. 10 tablet 0  . OVER THE COUNTER MEDICATION 1 tablet by Per post-pyloric tube route 2 (two) times daily as needed. Patient states she take generic Zantac but does not know what strength.    . Prenatal Vit-Fe Fumarate-FA (MULTIVITAMIN-PRENATAL) 27-0.8 MG TABS Take 1 tablet by mouth daily at 12 noon.     No current facility-administered medications for this visit.    REVIEW OF SYSTEMS:   Constitutional: Denies fevers, chills or abnormal night sweats Eyes: Denies blurriness  of vision, double vision or watery eyes Ears, nose, mouth, throat, and face: Denies mucositis or sore throat Respiratory: Denies cough, dyspnea or wheezes Cardiovascular: Denies palpitation, chest discomfort or lower extremity swelling Gastrointestinal:  Denies nausea, heartburn or change in bowel habits Skin: Denies abnormal skin rashes Lymphatics: Denies new lymphadenopathy or easy bruising Neurological:Denies numbness, tingling or new weaknesses Behavioral/Psych: Mood is stable, no new changes  Breast: palpable lump in the right breast All other systems were reviewed with the patient and are negative.  PHYSICAL EXAMINATION: ECOG PERFORMANCE STATUS: 0 - Asymptomatic  Filed Vitals:   01/19/15 0906  BP: 111/66  Pulse: 90  Temp: 98.3 F (36.8 C)  Resp: 18   Filed Weights   01/19/15 0906  Weight: 112 lb 12.8 oz (51.166 kg)    GENERAL:alert, no distress and comfortable SKIN: skin color, texture, turgor are normal, no rashes or significant lesions EYES: normal, conjunctiva are pink and non-injected, sclera clear OROPHARYNX:no exudate, no erythema and lips, buccal mucosa, and tongue normal  NECK: supple, thyroid normal size, non-tender, without nodularity LYMPH:  no palpable lymphadenopathy in the cervical, axillary or inguinal LUNGS: clear to auscultation and percussion with normal breathing effort HEART: regular rate & rhythm and no murmurs and no lower extremity edema ABDOMEN:abdomen soft, non-tender and normal bowel sounds Musculoskeletal:no cyanosis of digits and no clubbing  PSYCH: alert & oriented x 3 with fluent speech NEURO: no focal motor/sensory deficits BREAST: palpable nodule in the right breast. No palpable axillary or supraclavicular lymphadenopathy (exam performed in the presence of a chaperone)   LABORATORY DATA:  I have reviewed the data as listed Lab Results  Component Value Date   WBC 7.1 01/19/2015   HGB 13.4 01/19/2015   HCT 40.6 01/19/2015   MCV 82.5  01/19/2015   PLT 246 01/19/2015   Lab Results  Component Value Date   NA 140 01/19/2015   K 4.3 01/19/2015   CO2 25 01/19/2015   ASSESSMENT AND PLAN:  Breast cancer of upper-outer quadrant of right female breast ([facility]) Right breast biopsy 01/05/2015: IDC grade 2 with DCIS, right axillary lymph node biopsy negative, ER 100%, PR 40%, HER-2 negative, Ki-67 90% Right breast palpable mass at 10:30 position 12/31/2014: 1.5 x 1.1 x 1.3 cm with multiple abnormal appearing axillary lymph nodes largest 1.6 cm  Clinical stage: T1 cN0 stage IA Pathology and radiology counseling:Discussed with the patient, the details of pathology including the type of breast cancer,the clinical staging, the significance of ER, PR and HER-2/neu receptors and the implications for treatment. After reviewing the pathology in detail, we proceeded to discuss the different treatment options between surgery, radiation, chemotherapy, antiestrogen therapies.  Recommendations: staging CT scans and bone scan 1. Breast MRI 2. Genetics consultation  3. Breast conserving surgery followed by 4. Oncotype DX testing to determine if chemotherapy would be of any benefit followed by 5. Adjuvant radiation therapy followed by 6. Adjuvant antiestrogen  therapy  Oncotype counseling: I discussed Oncotype DX test. I explained to the patient that this is a 21 gene panel to evaluate patient tumors DNA to calculate recurrence score. This would help determine whether patient has high risk or intermediate risk or low risk breast cancer. She understands that if her tumor was found to be high risk, she would benefit from systemic chemotherapy. If low risk, no need of chemotherapy. If she was found to be intermediate risk, we would need to evaluate the score as well as other risk factors and determine if an abbreviated chemotherapy may be of benefit.  Return to clinic after surgery to discuss final pathology report and then determine if Oncotype DX  testing will need to be sent.  All questions were answered. The patient knows to call the clinic with any problems, questions or concerns.    Rulon Eisenmenger, MD 01/19/2015

## 2015-01-19 NOTE — Therapy (Signed)
[name], [place], [address] Phone: [phone]   Fax:  [phone]  Physical Therapy Evaluation  Patient Details  Name: Alexandria Richards MRN: [id number] Date of Birth: [birth year] Referring Provider: Dr. Fanny Skates  Encounter Date: 01/19/2015      PT End of Session - 01/19/15 1110    Visit Number 1   Number of Visits 1   PT Start Time 0927   PT Stop Time 0940  Also saw pt from 1130-1145 for a total of 28 minutes   PT Time Calculation (min) 13 min   Activity Tolerance Patient tolerated treatment well   Behavior During Therapy [facility] for tasks assessed/performed      Past Medical History  Diagnosis Date  . Abnormal Pap smear   . Post partum depression   . Pregnant   . Breast cancer of upper-outer quadrant of right female breast ([place]) 01/12/2015  . Breast cancer ([place])   . GERD (gastroesophageal reflux disease)   . Anxiety     Past Surgical History  Procedure Laterality Date  . Wisdom tooth extraction    . Leep    . Gynecologic cryosurgery      There were no vitals filed for this visit.  Visit Diagnosis:  Carcinoma of upper-outer quadrant of right female breast [facility]) - Plan: PT plan of care cert/re-cert  Abnormal posture - Plan: PT plan of care cert/re-cert      Subjective Assessment - 01/19/15 1147    Pertinent History Patient was diagnosed on 12/31/14 with right breast cancer in the upper outer quadrant measuring 1.5 cm. It is ER/PR positive and HER2 negative.            [facility] PT Assessment - 01/19/15 0001    Assessment   Medical Diagnosis Right breast cancer   Referring Provider Dr. Fanny Skates   Onset Date/Surgical Date 12/31/14   Hand Dominance Right   Prior Therapy none   Precautions   Precautions Other (comment)   Precaution Comments Active breast cancer   Restrictions   Weight Bearing Restrictions No   Balance Screen   Has the patient fallen in the past 6  months No   Has the patient had a decrease in activity level because of a fear of falling?  No   Is the patient reluctant to leave their home because of a fear of falling?  No   Home Ecologist residence   Living Arrangements Spouse/significant other  Lives with her boyfriend and her 39, 80, and 2 y.o. children   Available Help at Discharge Family   Prior Function   Level of Independence Independent   Vocation Full time employment   Armed forces training and education officer at [employer] She does not exercise   Cognition   Overall Cognitive Status Within Functional Limits for tasks assessed   Posture/Postural Control   Posture/Postural Control Postural limitations   Postural Limitations Forward head   ROM / Strength   AROM / PROM / Strength AROM;Strength   AROM   AROM Assessment Site Shoulder   Right/Left Shoulder Right;Left   Right Shoulder Extension 49 Degrees   Right Shoulder Flexion 149 Degrees   Right Shoulder ABduction 170 Degrees   Right Shoulder Internal Rotation 73 Degrees   Right Shoulder External Rotation 75 Degrees   Left Shoulder Extension 60 Degrees   Left Shoulder Flexion 152 Degrees   Left Shoulder ABduction 166 Degrees   Left Shoulder  Internal Rotation 72 Degrees   Left Shoulder External Rotation 85 Degrees   Strength   Overall Strength Within functional limits for tasks performed           LYMPHEDEMA/ONCOLOGY QUESTIONNAIRE - 01/19/15 1108    Type   Cancer Type Right breast cancer   Lymphedema Assessments   Lymphedema Assessments Upper extremities   Right Upper Extremity Lymphedema   10 cm Proximal to Olecranon Process 24.1 cm   Olecranon Process 21.8 cm   10 cm Proximal to Ulnar Styloid Process 18.6 cm   Just Proximal to Ulnar Styloid Process 13.2 cm   Across Hand at [employer] 16.5 cm   At [place] of 2nd Digit 5.2 cm   Left Upper Extremity Lymphedema   10 cm Proximal to Olecranon Process 23.7 cm   Olecranon Process  21.8 cm   10 cm Proximal to Ulnar Styloid Process 18 cm   Just Proximal to Ulnar Styloid Process 13.2 cm   Across Hand at [employer] 16.7 cm   At [place] of 2nd Digit 5 cm      Patient was instructed today in a home exercise program today for post op shoulder range of motion. These included active assist shoulder flexion in sitting, scapular retraction, wall walking with shoulder abduction, and hands behind head external rotation.  She was encouraged to do these twice a day, holding 3 seconds and repeating 5 times when permitted by her physician.         PT Education - 01/19/15 1109    Education provided Yes   Education Details Lymphedema risk reduction and post op shoulder ROM HEP   Person(s) Educated Patient;Caregiver(s)   Methods Explanation;Demonstration;Handout   Comprehension Verbalized understanding;Returned demonstration              Breast Clinic Goals - 01/19/15 1149    Patient will be able to verbalize understanding of pertinent lymphedema risk reduction practices relevant to her diagnosis specifically related to skin care.   Baseline No knowledge   Time 1   Period Days   Status Achieved   Patient will be able to return demonstrate and/or verbalize understanding of the post-op home exercise program related to regaining shoulder range of motion.   Baseline No knowledge   Time 1   Period Days   Status Achieved   Patient will be able to verbalize understanding of the importance of attending the postoperative After Breast Cancer Class for further lymphedema risk reduction education and therapeutic exercise.   Baseline No knowledge   Time 1   Period Days   Status Achieved              Plan - 01/19/15 1147    Clinical Impression Statement Patient was diagnosed on 12/31/14 with right breast cancer in the upper outer quadrant measuring 1.5 cm. It is ER/PR positive and HER2 negative.  She is planning to have a right lumpectomy and sentinel node biopsy  followed by Oncotype testing, radiation, and anti-estrogen therapy.  She may benefit from post op PT to regain shoulder ROM and strength.   Pt will benefit from skilled therapeutic intervention in order to improve on the following deficits Decreased strength;Decreased knowledge of precautions;Pain;Impaired UE functional use;Decreased range of motion   Rehab Potential Excellent   Clinical Impairments Affecting Rehab Potential none   PT Frequency One time visit   PT Treatment/Interventions Therapeutic exercise;Patient/family education   Consulted and Agree with Plan of Care Family member/caregiver   Family Member  Consulted boyfriend and sister     Patient will follow up at outpatient cancer rehab if needed following surgery.  If the patient requires physical therapy at that time, a specific plan will be dictated and sent to the referring physician for approval. The patient was educated today on appropriate basic range of motion exercises to begin post operatively and the importance of attending the After Breast Cancer class following surgery.  Patient was educated today on lymphedema risk reduction practices as it pertains to recommendations that will benefit the patient immediately following surgery.  She verbalized good understanding.  No additional physical therapy is indicated at this time.       Problem List Patient Active Problem List   Diagnosis Date Noted  . Breast cancer of upper-outer quadrant of right female breast ([place]) 01/12/2015    Annia Friendly, PT 01/19/2015 11:51 AM  [place], [place], [address] Phone: [phone]   Fax:  [phone]  Name: SHARIS KEERAN MRN: [id number] Date of Birth: 1975-01-28

## 2015-01-19 NOTE — Progress Notes (Signed)
Radiation Oncology         [phone] ________________________________  Name: Alexandria Richards MRN: [id number]  Date: 01/19/2015  DOB: 07-02-1975  QH:[id number], [place] Cage, NP  Kerin Perna, NP     REFERRING PHYSICIAN: Kerin Perna, NP   DIAGNOSIS: The encounter diagnosis was Breast cancer of upper-outer quadrant of right female breast ([place]).   HISTORY OF PRESENT ILLNESS::Alexandria Richards is a 40 y.o. female who is seen for an initial consultation visit. She had a palpable mass along the lateral aspect of the right breast and underwent a mammogram and ultrasound on 12/31/14. This showed a hypoechoic oval mass, in the right breast, with central calcifications measuring 1.5 x 1.1 x 1.3 cm and irregular margins. Multiple abnormal appearing axillary lymph nodes were discovered with the largest measuring 1.6 cm. This was BI-RADS CATEGORY 5: Highly suggestive of malignancy. A biopsy of the right breast mass and a pathologic level 1 right axillary lymph node were obtained on 01/05/15. 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 The pathology of the right breast was found to be concordant, but the lymph node is discordant. Also found on diagnostic mammogram mammogram on 01/05/15 was a suspicious group of pleomorphic calcifications about 1 cm inferior to the biopsied mass. She presents today for Breast Multidisciplinary Clinic.   PREVIOUS RADIATION THERAPY: No   PAST MEDICAL HISTORY:  has a past medical history of Abnormal Pap smear; Post partum depression; Pregnant; Breast cancer of upper-outer quadrant of right female breast ([place]) (01/12/2015); Breast cancer ([place]); GERD (gastroesophageal reflux disease); and Anxiety.     PAST SURGICAL HISTORY: Past Surgical History  Procedure Laterality Date  . Wisdom tooth extraction      . Leep    . Gynecologic cryosurgery       FAMILY HISTORY: family history includes Breast cancer in her maternal aunt.   SOCIAL HISTORY:  reports that she has quit smoking. She has never used smokeless tobacco. She reports that she drinks alcohol. She reports that she uses illicit drugs.   ALLERGIES: Review of patient's allergies indicates no known allergies.   MEDICATIONS:  Current Outpatient Prescriptions  Medication Sig Dispense Refill  . acetaminophen (TYLENOL) 100 MG/ML solution Take 10 mg/kg by mouth every 4 (four) hours as needed for fever.    . cyclobenzaprine (FLEXERIL) 10 MG tablet Take 10 mg by mouth daily as needed for muscle spasms.    Marland Kitchen escitalopram (LEXAPRO) 10 MG tablet Take 10 mg by mouth daily.    Marland Kitchen HYDROcodone-acetaminophen (NORCO/VICODIN) 5-325 MG per tablet Take 0.5-1 tablets by mouth daily as needed for pain. 20 tablet 0  . ibuprofen (ADVIL,MOTRIN) 100 MG tablet Take 100 mg by mouth every 6 (six) hours as needed for fever.    . ondansetron (ZOFRAN ODT) 4 MG disintegrating tablet Take 1 tablet (4 mg total) by mouth every 8 (eight) hours as needed for nausea. 10 tablet 0  . OVER THE COUNTER MEDICATION 1 tablet by Per post-pyloric tube route 2 (two) times daily as needed. Patient states she take generic Zantac but does not know what strength.    . prazosin (MINIPRESS) 1 MG capsule TAKE 1 CAPSULE BY MOUTH AT BEDTIME FOR NIGHTMARES  1  . Prenatal Vit-Fe Fumarate-FA (MULTIVITAMIN-PRENATAL) 27-0.8 MG TABS Take 1 tablet by mouth daily at 12 noon.  No current facility-administered medications for this encounter.     REVIEW OF SYSTEMS:  A 15 point review of systems is documented in the electronic medical record. This was obtained by the nursing staff. However, I reviewed this with the patient to discuss relevant findings and make appropriate changes.  Pertinent items noted in HPI and remainder of comprehensive ROS otherwise negative.  Gynecologic History  Age at  first menstrual period? 15  Are you still having periods? Yes Approximate date of last period? 12/03/14  If you are still having periods: Are your periods regular? Yes  If you no longer have periods: Have you used hormone replacement? No Obstetric History:  How many children have you carried to term? 3 Your age at first live birth? 71  Pregnant now or trying to get pregnant? No  Have you used birth control pills or hormone shots for contraception? Yes  If so, for how long (or approximate dates)? Pills and shots for 23 years  Would you be interested in learning more about the options to preserve fertility? No Health Maintenance:  Have you ever had a colonoscopy? No  Have you ever had a bone density? No  Date of your last PAP smear? 2/16 Date of your FIRST mammogram? 12/31/14  She experiences weight change, loss of sleep, fatigue that affects her activities, pain in the back and breast, wears glasses and contacts, a dry cough, poor appetite, heartburn, incontinence, lump in the right breast, headaches, anxiety, and depression.  PHYSICAL EXAM: Small palpable tumor in the uoq of the right breast. Some palpable adenopathy present in the right axilla. Otherwise neg breast exam. General: Well-developed, in no acute distress HEENT: Normocephalic, atraumatic; oral cavity clear Neck: Supple without any lymphadenopathy Cardiovascular: Regular rate and rhythm Respiratory: Clear to auscultation bilaterally GI: Soft, nontender, normal bowel sounds Extremities: No edema present Neuro: No focal deficits  Vitals with BMI 01/19/2015  Height 5' 2"  Weight 112 lbs 13 oz  BMI 83.6  Systolic 629  Diastolic 66  Pulse 90  Respirations 18   ECOG = 1  0 - Asymptomatic (Fully active, able to carry on all predisease activities without restriction)  1 - Symptomatic but completely ambulatory (Restricted in physically strenuous activity but ambulatory and able to carry out work of a light or sedentary nature.  For example, light housework, office work)  2 - Symptomatic, <50% in bed during the day (Ambulatory and capable of all self care but unable to carry out any work activities. Up and about more than 50% of waking hours)  3 - Symptomatic, >50% in bed, but not bedbound (Capable of only limited self-care, confined to bed or chair 50% or more of waking hours)  4 - Bedbound (Completely disabled. Cannot carry on any self-care. Totally confined to bed or chair)  5 - Death   Eustace Pen MM, Creech RH, Tormey DC, et al. [phone]). "Toxicity and response criteria of the [facility]". [place] Oncol. 5 (6): 649-55     LABORATORY DATA:  Lab Results  Component Value Date   WBC 7.1 01/19/2015   HGB 13.4 01/19/2015   HCT 40.6 01/19/2015   MCV 82.5 01/19/2015   PLT 246 01/19/2015   Lab Results  Component Value Date   NA 140 01/19/2015   K 4.3 01/19/2015   CO2 25 01/19/2015   Lab Results  Component Value Date   ALT 20 01/19/2015   AST 19 01/19/2015   ALKPHOS 53 01/19/2015   BILITOT  0.32 01/19/2015      RADIOGRAPHY: Mm Digital Diagnostic Unilat R  01/05/2015  CLINICAL DATA:  Confirmation of clip placement after ultrasound-guided core needle biopsy of a palpable 1.7 cm mass in the upper outer quadrant of the right breast and core needle biopsy of a pathologic level 1 right axillary lymph node. The biopsies were performed earlier today. EXAM: DIAGNOSTIC RIGHT MAMMOGRAM POST ULTRASOUND BIOPSY COMPARISON:  Previous exam(s). FINDINGS: Mammographic images were obtained following ultrasound guided biopsy of a 1.7 cm mass in the upper outer quadrant of the right breast and a pathologic level 1 right axillary lymph node. The ribbon shaped tissue marker clip is appropriately positioned within the mass in the upper outer quadrant, posterior depth. Of note, a suspicious group of pleomorphic calcifications are present approximately 1 cm inferior to the biopsied mass. The right axillary lymph  nodes are not visible on the MLO image. Expected post biopsy changes are present without evidence of hematoma. IMPRESSION: Appropriate positioning of the ribbon shaped tissue marker clip within the mass in the upper outer quadrant of the right breast. The right axillary lymph nodes are not visible on the MLO image. Final Assessment: Post Procedure Mammograms for Marker Placement Electronically Signed   By: Evangeline Dakin M.D.   On: 01/05/2015 12:47   US Breast Ltd Uni Right Inc Axilla  12/31/2014  CLINICAL DATA:  Patient has a palpable mass along the lateral aspect of the right breast. EXAM: DIGITAL DIAGNOSTIC BILATERAL MAMMOGRAM WITH 3D TOMOSYNTHESIS WITH CAD ULTRASOUND RIGHT BREAST COMPARISON:  None ACR Breast Density Category b: There are scattered areas of fibroglandular density. FINDINGS: There is an oval mass with associated central round calcifications in the upper outer right breast. Margins are mildly irregular. Also multiple area to mildly enlarged lymph nodes in the right axilla. There are no other masses. No abnormality is seen in the left breast. No left axillary adenopathy. Mammographic images were processed with CAD. On physical exam, there is a firm mobile mass in the lateral aspect of the right breast. Targeted ultrasound is performed, showing a hypoechoic oval mass with central calcifications in the 10:30 o'clock position of the right breast, 6 cm the nipple, measuring 1.5 x 1.1 x 1.3 cm. Mass shows internal blood flow. The margins are mildly irregular. There are multiple abnormal appearing axillary lymph nodes. Largest measures 1.6 cm in greatest dimension. Most of the abnormal lymph node show loss of normal hilar fat. IMPRESSION: 1. Highly suspicious right breast carcinoma with metastatic axillary adenopathy. RECOMMENDATION: 1. Ultrasound-guided core needle biopsy of a right breast mass and 1 of the larger abnormal appearing right axillary lymph nodes. I have discussed the findings and  recommendations with the patient. Results were also provided in writing at the conclusion of the visit. If applicable, a reminder letter will be sent to the patient regarding the next appointment. BI-RADS CATEGORY  5: Highly suggestive of malignancy. Electronically Signed   By: Lajean Manes M.D.   On: 12/31/2014 15:49   Mm Diag Breast Tomo Bilateral  12/31/2014  CLINICAL DATA:  Patient has a palpable mass along the lateral aspect of the right breast. EXAM: DIGITAL DIAGNOSTIC BILATERAL MAMMOGRAM WITH 3D TOMOSYNTHESIS WITH CAD ULTRASOUND RIGHT BREAST COMPARISON:  None ACR Breast Density Category b: There are scattered areas of fibroglandular density. FINDINGS: There is an oval mass with associated central round calcifications in the upper outer right breast. Margins are mildly irregular. Also multiple area to mildly enlarged lymph nodes in the right axilla. There  are no other masses. No abnormality is seen in the left breast. No left axillary adenopathy. Mammographic images were processed with CAD. On physical exam, there is a firm mobile mass in the lateral aspect of the right breast. Targeted ultrasound is performed, showing a hypoechoic oval mass with central calcifications in the 10:30 o'clock position of the right breast, 6 cm the nipple, measuring 1.5 x 1.1 x 1.3 cm. Mass shows internal blood flow. The margins are mildly irregular. There are multiple abnormal appearing axillary lymph nodes. Largest measures 1.6 cm in greatest dimension. Most of the abnormal lymph node show loss of normal hilar fat. IMPRESSION: 1. Highly suspicious right breast carcinoma with metastatic axillary adenopathy. RECOMMENDATION: 1. Ultrasound-guided core needle biopsy of a right breast mass and 1 of the larger abnormal appearing right axillary lymph nodes. I have discussed the findings and recommendations with the patient. Results were also provided in writing at the conclusion of the visit. If applicable, a reminder letter will  be sent to the patient regarding the next appointment. BI-RADS CATEGORY  5: Highly suggestive of malignancy. Electronically Signed   By: Lajean Manes M.D.   On: 12/31/2014 15:49   [place] Rt Breast Bx W Loc Dev 1st Lesion Img Bx Spec US Guide  01/07/2015  ADDENDUM REPORT: 01/07/2015 07:58 ADDENDUM: Pathology revealed GRADE II INVASIVE DUCTAL CARCINOMA AND DUCTAL CARCINOMA IN SITU in the right breast and no malignancy found in the right axillary lymph node. The pathology for the breast was found to be concordant by Dr. Evangeline Dakin, though the lymph node is discordant. Pathology was discussed with the patient and her husband by telephone. She reported doing well after the biopsies with tenderness at the sites. Post biopsy instructions and care were reviewed and her questions were answered. She has been scheduled at [facility] on January 19, 2015. My number was provided for additional questions and concerns. Pathology results reported by Susa Raring RN, BSN on January 07, 2015. Electronically Signed   By: Evangeline Dakin M.D.   On: 01/07/2015 07:58  01/07/2015  CLINICAL DATA:  40 year old with a suspicious palpable approximate 1.7 cm mass in the upper outer quadrant of the right breast and multiple pathologic level 1 and level 2 right axillary lymph nodes identified on recent diagnostic workup. EXAM: ULTRASOUND GUIDED RIGHT BREAST CORE NEEDLE BIOPSY COMPARISON:  Previous exam(s). FINDINGS: I met with the patient and we discussed the procedure of ultrasound-guided biopsy, including benefits and alternatives. We discussed the high likelihood of a successful procedure. We discussed the risks of the procedure, including infection, bleeding, tissue injury, clip migration, and inadequate sampling. Informed written consent was given. The usual time-out protocol was performed immediately prior to the procedure. Using sterile technique with chlorhexidine for skin antisepsis, 1%  lidocaine as local anesthetic, under direct ultrasound visualization, a 12 gauge Bard Marquee core needle device was used to perform biopsy of the mass in the upper outer quadrant of the right breast at the 10:30 o'clock position approximately 6 cm from the nipple using an inferior approach. At the conclusion of the procedure a ribbon shaped tissue marker clip was deployed into the biopsy cavity. Follow up 2 view mammogram was performed and dictated separately. IMPRESSION: Ultrasound guided biopsy of a suspicious 1.7 cm mass in the upper outer quadrant of the right breast. No apparent complications. Electronically Signed: By: Evangeline Dakin M.D. On: 01/05/2015 12:37   [place] Rt Breast Bx W Loc Dev Ea Add Lesion Img  Bx Spec US Guide  01/05/2015  CLINICAL DATA:  40 year old with a palpable approximate 1.7 cm mass in the upper outer quadrant of the right breast and multiple pathologic level 1 and level 2 right axillary lymph nodes identified on recent diagnostic workup. EXAM: ULTRASOUND GUIDED CORE NEEDLE BIOPSY OF A RIGHT AXILLARY NODE COMPARISON:  Previous exam(s). FINDINGS: I met with the patient and we discussed the procedure of ultrasound-guided biopsy, including benefits and alternatives. We discussed the high likelihood of a successful procedure. We discussed the risks of the procedure, including infection, bleeding, tissue injury, clip migration, and inadequate sampling. Informed written consent was given. The usual time-out protocol was performed immediately prior to the procedure. Using sterile technique with chlorhexidine for skin antisepsis, 1% lidocaine as local anesthetic, under direct ultrasound visualization, a 14 gauge Bard Marquee core needle device was used to perform biopsy of a level 1 right axillary lymph node using an inferior approach. At the conclusion of the procedure a spiral Hydromark tissue marker clip was deployed into the biopsy cavity. Follow up 2 view mammogram was performed and  dictated separately. IMPRESSION: Ultrasound guided biopsy of a pathologic level 1 right axillary lymph node. No apparent complications. Electronically Signed   By: Evangeline Dakin M.D.   On: 01/05/2015 12:40       IMPRESSION: Patient has new diagnosis of invasive ductal carcinoma of the right breast. [id number]. Patient had enlarged nodes on imaging, but biopsy was negative with lymph tissue present: false negative vs. Reactive nodes. Patient is a good candidate for adjuvant radiation treatment after an anticipated lumpectomy. Discussed with the patient a potential 6 1/2 week course of treatment.  PLAN: She would receive genetics counseling and will get oncotype testing. She will then proceed with a bilateral breast MRI for staging purposes. She will then undergo a right breast lumpectomy if no additional findings are seen on MRI, radiation treatment, and then hormone therapy. An oncotype was also discussed in multidisciplinary clinic to help determine the potential benefit of chemotherapy.   ________________________________   Jodelle Gross, MD, PhD  This document serves as a record of services personally performed by Kyung Rudd, MD. It was created on his behalf by Darcus Austin, a trained medical scribe. The creation of this record is based on the scribe's personal observations and the provider's statements to them. This document has been checked and approved by the attending provider.

## 2015-01-19 NOTE — Patient Instructions (Signed)

## 2015-01-19 NOTE — Progress Notes (Signed)
Clinical Social Work [place] Psychosocial Distress Screening [place]  Patient completed distress screening protocol and scored a 7 on the Psychosocial Distress Thermometer which indicates moderate distress. Clinical Social Worker met with patient and patients family in [facility] to assess for distress and other psychosocial needs. Patient stated she was feeling overwhelmed but felt "better" after meeting with the treatment team and getting more information on her treatment plan. CSW and patient discussed common feeling and emotions when being diagnosed with cancer, and the importance of support during treatment. CSW informed patient of the support team and support services at [facility], and patient was agreeable to an alight guide. CSW provided contact information and encouraged patient to call with any questions or concerns.   ONCBCN DISTRESS SCREENING 01/19/2015  Screening Type Initial Screening  Distress experienced in past week (1-10) 7  Practical problem type Work/school  Emotional problem type Depression;Nervousness/Anxiety;Adjusting to illness;Feeling hopeless  Spiritual/Religous concerns type Relating to God  Information Concerns Type Lack of info about diagnosis;Lack of info about treatment;Lack of info about complementary therapy choices;Lack of info about maintaining fitness  Physical Problem type Pain;Sleep/insomnia;Loss of appetitie;Skin dry/itchy  Physician notified of physical symptoms Yes  Referral to clinical psychology No  Referral to clinical social work Yes  Referral to dietition No  Referral to financial advocate No  Referral to support programs Yes  Referral to palliative care No   Johnnye Lana, MSW, LCSW, OSW-C Clinical Social Worker [place] [phone]

## 2015-01-19 NOTE — Progress Notes (Signed)
Subjective:     Patient ID: Alexandria Richards, female   DOB: May 14, 1975, 40 y.o.   MRN: [id number]  HPI   Review of Systems     Objective:   Physical Exam For the patient to understand and be given the tools to implement a healthy plant based diet during their cancer diagnosis.     Assessment:     Patient was seen today and found to be worried and accompanied by her life partner and friend. Pt is 112 pounds, 5'2'', and BMI 20.7.  Pts medications: prenatal vitamin. Pt states she eats for three days and "stores the food like a squirrel" and then fasts for one or more days. Pt states she has very little to know appetite and has been interminably fasting for about one year. Pt states she is under a lot of stress from a new job and feels it is more important to worry about feeding her children and boyfriend than herself. Pt states she eats until her belly becomes distended and then fasts for multiple days. Pt states her usual weight as an adult was 120 pounds, she states she was 107 pounds.     Plan:     Dietitian educated the pt on the importance of eating, having a relaxed environment for meal times, and the importance of self-care/stress relief. Dietitian also suggested ensure/boosts for days she cannot get herself to eat food while cautioning using them as a crutch. Dietitian emphasized Ernestene Kiel CSO,RD,LDN as a good resource.

## 2015-01-19 NOTE — Assessment & Plan Note (Signed)
Right breast biopsy 01/05/2015: IDC grade 2 with DCIS, right axillary lymph node biopsy negative, ER 100%, PR 40%, HER-2 negative, Ki-67 90% Right breast palpable mass at 10:30 position 12/31/2014: 1.5 x 1.1 x 1.3 cm with multiple abnormal appearing axillary lymph nodes largest 1.6 cm  Clinical stage: T1 cN0 stage IA Pathology and radiology counseling:Discussed with the patient, the details of pathology including the type of breast cancer,the clinical staging, the significance of ER, PR and HER-2/neu receptors and the implications for treatment. After reviewing the pathology in detail, we proceeded to discuss the different treatment options between surgery, radiation, chemotherapy, antiestrogen therapies.  Recommendations: 1. Breast MRI 2. Genetics consultation  3. Breast conserving surgery followed by 4. Oncotype DX testing to determine if chemotherapy would be of any benefit followed by 5. Adjuvant radiation therapy followed by 6. Adjuvant antiestrogen therapy  Oncotype counseling: I discussed Oncotype DX test. I explained to the patient that this is a 21 gene panel to evaluate patient tumors DNA to calculate recurrence score. This would help determine whether patient has high risk or intermediate risk or low risk breast cancer. She understands that if her tumor was found to be high risk, she would benefit from systemic chemotherapy. If low risk, no need of chemotherapy. If she was found to be intermediate risk, we would need to evaluate the score as well as other risk factors and determine if an abbreviated chemotherapy may be of benefit.  Return to clinic after surgery to discuss final pathology report and then determine if Oncotype DX testing will need to be sent.

## 2015-01-19 NOTE — Progress Notes (Signed)
Ms. Fantini is a very pleasant 40 y.o. 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  Biopsy results revealed the tumor's prognostic profile is ER positive, PR positive, and HER2/neu negative. Ki67 is 90%.  She presents today with her boyfriend and sister to the [facility]) for treatment consideration and recommendations from the breast surgeon, radiation oncologist, and medical oncologist.     I briefly met with Ms. Moura, her boyfriend, and her sister during her [facility] visit today. We discussed the purpose of the Survivorship Clinic, which will include monitoring for recurrence, coordinating completion of age and gender-appropriate cancer screenings, promotion of overall wellness, as well as managing potential late/long-term side effects of anti-cancer treatments.    The treatment plan for Ms. Lamoreaux will likely include surgery, radiation therapy, and anti-estrogen therapy.  She will meet with the Genetics Counselor due to her age and family history of breast cancer. As of today, the intent of treatment for Ms. Adler is cure, therefore she will be eligible for the Survivorship Clinic upon her completion of treatment.  Her survivorship care plan (SCP) document will be drafted and updated throughout the course of her treatment trajectory. She will receive the SCP in an office visit with myself in the Survivorship Clinic once she has completed treatment.   Ms. Brose was encouraged to ask questions and all questions were answered to her satisfaction.  She was given my business card and encouraged to contact me with any concerns regarding survivorship.  I look forward to participating in her care.   Kenn File, [place] [phone]

## 2015-01-20 ENCOUNTER — Encounter: Payer: Self-pay | Admitting: Radiation Oncology

## 2015-01-20 ENCOUNTER — Encounter: Payer: Medicaid Other | Admitting: Genetic Counselor

## 2015-01-20 ENCOUNTER — Telehealth: Payer: Self-pay | Admitting: *Deleted

## 2015-01-20 NOTE — Telephone Encounter (Signed)
Called pt and confirmed 01/21/15 genetic appt w/ her.

## 2015-01-21 ENCOUNTER — Encounter: Payer: Self-pay | Admitting: Genetic Counselor

## 2015-01-21 ENCOUNTER — Ambulatory Visit (HOSPITAL_BASED_OUTPATIENT_CLINIC_OR_DEPARTMENT_OTHER): Payer: Medicaid Other | Admitting: Genetic Counselor

## 2015-01-21 ENCOUNTER — Other Ambulatory Visit: Payer: Medicaid Other

## 2015-01-21 DIAGNOSIS — Z803 Family history of malignant neoplasm of breast: Secondary | ICD-10-CM | POA: Diagnosis not present

## 2015-01-21 DIAGNOSIS — Z315 Encounter for genetic counseling: Secondary | ICD-10-CM | POA: Diagnosis not present

## 2015-01-21 DIAGNOSIS — C50411 Malignant neoplasm of upper-outer quadrant of right female breast: Secondary | ICD-10-CM

## 2015-01-21 NOTE — Progress Notes (Signed)
REFERRING PROVIDER: Kerin Perna, NP [place], [place] [address]   Alexandria Lose, MD  PRIMARY PROVIDER:  EDWARDS, Milford Cage, NP  PRIMARY REASON FOR VISIT:  1. Breast cancer of upper-outer quadrant of right female breast ([place])   2. Family history of breast cancer      HISTORY OF PRESENT ILLNESS:   Alexandria Richards, a 40 y.o. female, was seen for a [facility] cancer genetics consultation at the request of Dr. [name] due to a personal and family history of cancer.  Alexandria Richards presents to clinic today to discuss the possibility of a hereditary predisposition to cancer, genetic testing, and to further clarify her future cancer risks, as well as potential cancer risks for family members.   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 The tumor is ER+/PR+/Her2-.  This is going to be treated with lumpectomy and radiation.  Tumor tesitng will be performed to determine if chemotherapy is needed.  Genetics will inform her surgery.   CANCER HISTORY:    Breast cancer of upper-outer quadrant of right female breast ([place])   12/31/2014 Mammogram Right breast over the palpable mass at 10:30 position: 1.5 x 1.1 x 1.3 cm with multiple abnormal appearing axillary lymph nodes largest 1.6 cm   01/05/2015 Initial Diagnosis Right breast biopsy: IDC grade 2 with DCIS, right axillary lymph node biopsy negative, ER 100%, PR 40%, HER-2 negative, Ki-67 90%     HORMONAL RISK FACTORS:  Menarche was at age 41.  First live birth at age 3.  OCP use for approximately 6 years.  Ovaries intact: yes.  Hysterectomy: no.  Menopausal status: premenopausal.  HRT use: 0 years. Colonoscopy: no; not examined. Mammogram within the last year: yes. Number of breast biopsies: 1. Up to date with pelvic exams:  yes. Any excessive radiation exposure in the past:  no  Past Medical History  Diagnosis Date  . Abnormal Pap smear   . Post partum  depression   . Pregnant   . Breast cancer of upper-outer quadrant of right female breast ([place]) 01/12/2015  . Breast cancer ([place])   . GERD (gastroesophageal reflux disease)   . Anxiety   . Family history of breast cancer     Past Surgical History  Procedure Laterality Date  . Wisdom tooth extraction    . Leep    . Gynecologic cryosurgery      Social History   Social History  . Marital Status: Single    Spouse Name: N/A  . Number of Children: 3  . Years of Education: N/A   Social History Main Topics  . Smoking status: Former Research scientist (life sciences)  . Smokeless tobacco: Never Used  . Alcohol Use: Yes  . Drug Use: Yes  . Sexual Activity: Yes    Birth Control/ Protection: None   Other Topics Concern  . None   Social History Narrative     FAMILY HISTORY:  We obtained a detailed, 4-generation family history.  Significant diagnoses are listed below: Family History  Problem Relation Age of Onset  . Breast cancer Maternal Aunt     dx over 26  . Autism Brother     paternal 1/2 brother    The patient has three children who are cancer free.  She has two full sisters who are cancer free and a paternal half brother who is autistic.  Both parents are alive and cancer free.  Her mother  has 10 siblings, one sister had breast cancer over the age of 57, and another sister had a daughter who had an abdominal tumor that made her look pregnant, but never underwent chemotherapy and is alive and well today.  The patient's father had seven siblings, none of whom had cancer.  There is no other reported cancer history.  Patient's maternal ancestors are of [place] and [place] American descent, and paternal ancestors are of Caucasian and African American descent. There is no reported Ashkenazi Jewish ancestry. There is no known consanguinity.  GENETIC COUNSELING ASSESSMENT: Alexandria Richards is a 40 y.o. female with a personal and fmaily history of breast cancer which is somewhat suggestive of a  hereditary cancer syndrome and predisposition to cancer. We, therefore, discussed and recommended the following at today's visit.   DISCUSSION: We discussed that about 5-10% of breast cancer is hereditary, with most cases due to BRCA mutations.  We discussed that the next most common genes with hereditary mutations seen in our clinic include PALB2, ATM and CHEK2.  We reviewed the characteristics, features and inheritance patterns of hereditary cancer syndromes. We also discussed genetic testing, including the appropriate family members to test, the process of testing, insurance coverage and turn-around-time for results. We discussed the implications of a negative, positive and/or variant of uncertain significant result. We recommended Alexandria Richards pursue genetic testing for the Breast/Ovarian cancer gene panel. The Breast/Ovarian gene panel offered by [facility] includes sequencing and rearrangement analysis for the following 20 genes:  ATM, BARD1, BRCA1, BRCA2, BRIP1, CDH1, CHEK2, EPCAM, FANCC, MLH1, MSH2, MSH6, NBN, PALB2, PMS2, PTEN, RAD51C, RAD51D, TP53, and XRCC2.     Based on Alexandria Richards's personal and family history of cancer, she meets medical criteria for genetic testing. Despite that she meets criteria, she may still have an out of pocket cost. We discussed that if her out of pocket cost for testing is over $100, the laboratory will call and confirm whether she wants to proceed with testing.  If the out of pocket cost of testing is less than $100 she will be billed by the genetic testing laboratory.   PLAN: After considering the risks, benefits, and limitations, Alexandria Richards  provided informed consent to pursue genetic testing and the blood sample was sent to [facility] for analysis of the Breast/Ovarian cancer gene panel. We have asked that the results be reported out STAT.  This means that results should be available within approximately 2-3 weeks' time, hopefully closer to the 2 week timeframe,  at which point they will be disclosed by telephone to Alexandria Richards, as will any additional recommendations warranted by these results. Alexandria Richards will receive a summary of her genetic counseling visit and a copy of her results once available. This information will also be available in Epic. We encouraged Alexandria Richards to remain in contact with cancer genetics annually so that we can continuously update the family history and inform her of any changes in cancer genetics and testing that may be of benefit for her family. Alexandria Richards questions were answered to her satisfaction today. Our contact information was provided should additional questions or concerns arise.  Lastly, we encouraged Alexandria Richards to remain in contact with cancer genetics annually so that we can continuously update the family history and inform her of any changes in cancer genetics and testing that may be of benefit for this family.   Ms.  Richards questions were answered to her satisfaction today. Our contact information was provided should  additional questions or concerns arise. Thank you for the referral and allowing [place] to share in the care of your patient.   Ketra Duchesne P. Florene Glen, [place], [facility] Certified Genetic Counselor [email or web address] phone: [phone]  The patient was seen for a total of 30 minutes in face-to-face genetic counseling.  This patient was discussed with Drs. Magrinat, [name] and/or Burr Medico who agrees with the above.    _______________________________________________________________________ For Office Staff:  Number of people involved in session: 1 Was an Intern/ student involved with case: no

## 2015-01-25 ENCOUNTER — Telehealth: Payer: Self-pay | Admitting: *Deleted

## 2015-01-25 ENCOUNTER — Ambulatory Visit
Admission: RE | Admit: 2015-01-25 | Discharge: 2015-01-25 | Disposition: A | Discharge status: Home / Self Care | Payer: Medicaid Other | Source: Ambulatory Visit | Attending: General Surgery | Admitting: General Surgery

## 2015-01-25 DIAGNOSIS — C50411 Malignant neoplasm of upper-outer quadrant of right female breast: Secondary | ICD-10-CM

## 2015-01-25 MED ORDER — GADOBENATE DIMEGLUMINE 529 MG/ML IV SOLN
10.0000 mL | Freq: Once | INTRAVENOUS | Status: AC | PRN
  Administered 2015-01-25: 10 mL via INTRAVENOUS

## 2015-01-25 NOTE — Telephone Encounter (Signed)
Spoke with patient to follow up from [facility] 01/19/15.  She is doing well.  Encouraged her to call with any questions or concerns.

## 2015-01-28 ENCOUNTER — Other Ambulatory Visit: Payer: Self-pay | Admitting: General Surgery

## 2015-01-28 DIAGNOSIS — C50411 Malignant neoplasm of upper-outer quadrant of right female breast: Secondary | ICD-10-CM

## 2015-02-01 ENCOUNTER — Other Ambulatory Visit: Payer: Self-pay | Admitting: General Surgery

## 2015-02-01 DIAGNOSIS — R591 Generalized enlarged lymph nodes: Secondary | ICD-10-CM

## 2015-02-02 ENCOUNTER — Telehealth: Payer: Self-pay | Admitting: Genetic Counselor

## 2015-02-02 ENCOUNTER — Encounter: Payer: Self-pay | Admitting: Genetic Counselor

## 2015-02-02 ENCOUNTER — Encounter: Payer: Medicaid Other | Admitting: Genetic Counselor

## 2015-02-02 DIAGNOSIS — Z1379 Encounter for other screening for genetic and chromosomal anomalies: Secondary | ICD-10-CM | POA: Insufficient documentation

## 2015-02-02 NOTE — Telephone Encounter (Signed)
LM on VM that results were back and to please call me back.  Left CB instructions.

## 2015-02-02 NOTE — Telephone Encounter (Signed)
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  She scheduled a return appointment for 02/07/2015.

## 2015-02-07 ENCOUNTER — Ambulatory Visit
Admission: RE | Admit: 2015-02-07 | Discharge: 2015-02-07 | Disposition: A | Discharge status: Home / Self Care | Payer: Medicaid Other | Source: Ambulatory Visit | Attending: General Surgery | Admitting: General Surgery

## 2015-02-07 ENCOUNTER — Encounter: Payer: Medicaid Other | Admitting: Genetic Counselor

## 2015-02-07 DIAGNOSIS — R591 Generalized enlarged lymph nodes: Secondary | ICD-10-CM

## 2015-02-07 MED ORDER — IOPAMIDOL (ISOVUE-300) INJECTION 61%
75.0000 mL | Freq: Once | INTRAVENOUS | Status: AC | PRN
  Administered 2015-02-07: 75 mL via INTRAVENOUS

## 2015-02-08 ENCOUNTER — Other Ambulatory Visit: Payer: Self-pay | Admitting: General Surgery

## 2015-02-08 DIAGNOSIS — C50411 Malignant neoplasm of upper-outer quadrant of right female breast: Secondary | ICD-10-CM

## 2015-02-10 ENCOUNTER — Encounter: Payer: Self-pay | Admitting: *Deleted

## 2015-02-10 ENCOUNTER — Encounter (HOSPITAL_BASED_OUTPATIENT_CLINIC_OR_DEPARTMENT_OTHER): Payer: Self-pay | Admitting: *Deleted

## 2015-02-14 ENCOUNTER — Ambulatory Visit (HOSPITAL_BASED_OUTPATIENT_CLINIC_OR_DEPARTMENT_OTHER): Payer: Medicaid Other | Admitting: Genetic Counselor

## 2015-02-14 ENCOUNTER — Encounter: Payer: Self-pay | Admitting: Genetic Counselor

## 2015-02-14 ENCOUNTER — Encounter (HOSPITAL_BASED_OUTPATIENT_CLINIC_OR_DEPARTMENT_OTHER)
Admission: RE | Admit: 2015-02-14 | Discharge: 2015-02-14 | Disposition: A | Discharge status: Home / Self Care | Payer: Medicaid Other | Source: Ambulatory Visit | Attending: General Surgery | Admitting: General Surgery

## 2015-02-14 DIAGNOSIS — Z803 Family history of malignant neoplasm of breast: Secondary | ICD-10-CM

## 2015-02-14 DIAGNOSIS — Z1509 Genetic susceptibility to other malignant neoplasm: Secondary | ICD-10-CM

## 2015-02-14 DIAGNOSIS — Z315 Encounter for genetic counseling: Secondary | ICD-10-CM

## 2015-02-14 DIAGNOSIS — D0581 Other specified type of carcinoma in situ of right breast: Secondary | ICD-10-CM | POA: Insufficient documentation

## 2015-02-14 DIAGNOSIS — Z1379 Encounter for other screening for genetic and chromosomal anomalies: Secondary | ICD-10-CM

## 2015-02-14 DIAGNOSIS — Z1502 Genetic susceptibility to malignant neoplasm of ovary: Secondary | ICD-10-CM

## 2015-02-14 DIAGNOSIS — C50411 Malignant neoplasm of upper-outer quadrant of right female breast: Secondary | ICD-10-CM

## 2015-02-14 DIAGNOSIS — Z1589 Genetic susceptibility to other disease: Secondary | ICD-10-CM

## 2015-02-14 DIAGNOSIS — C50919 Malignant neoplasm of unspecified site of unspecified female breast: Secondary | ICD-10-CM

## 2015-02-14 LAB — PREGNANCY, URINE: Preg Test, Ur: NEGATIVE

## 2015-02-14 NOTE — Progress Notes (Signed)
REFERRING PROVIDER: Kerin Perna, NP [place], [place] [address]   Marygrace Drought, MD  Nicholas Lose, MD  PRIMARY PROVIDER:  Kerin Perna, NP  PRIMARY REASON FOR VISIT:  1. Genetic testing   2. 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 Family history of breast cancer   4. Breast cancer of upper-outer quadrant of right female breast [facility])      GENETIC TEST RESULTS    Ms. Frohlich was seen in the [facility] clinic on February 14, 2015 due to a personal and family history of cancer and concern regarding a hereditary predisposition to cancer in the family. Please refer to the prior Genetics clinic note for more information regarding Ms. Lease's medical and family histories and our assessment at the time.   GENETIC TESTING:  At the time of Ms. Vandruff's visit, we recommended she pursue genetic testing of the Breast/Ovarian cancer genes. The Breast/Ovarian gene panel offered by [facility] includes sequencing and rearrangement analysis for the following 20 genes:  ATM, BARD1, BRCA1, BRCA2, BRIP1, CDH1, CHEK2, EPCAM, FANCC, MLH1, MSH2, MSH6, NBN, PALB2, PMS2, PTEN, RAD51C, RAD51D, TP53, and XRCC2.   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  Genetic testing did detect a Variant of Unknown Significance in the ATM gene called c.6179G>C. At this time, it is unknown if this variant is associated with increased cancer risk or if this is a normal finding, but most variants such as this get reclassified to being inconsequential. It should not be used to make medical management decisions. With time, we suspect the lab will determine the significance of this variant, if any. If we do learn more about it, we will try to contact Ms. Rigg to discuss it further. However, it is important to stay in touch with [place] periodically and keep the address and phone number up to date.   DISCUSSION: The PALB2 gene provides instructions for making a protein called "Partner  And Localizer of BRCA2". As its name suggests, this protein interacts with the protein produced from the BRCA2 gene. These two proteins work together to mend broken strands of DNA, which prevents cells from accumulating genetic damage that can trigger them to divide uncontrollably. Because the PALB2 and BRCA2 proteins help control the rate of cell growth and division, they are described as tumor suppressors.  The PALB2 protein stabilizes the BRCA2 protein which allows the BRCA2 protein to fix damaged DNA. Breaks in DNA can be caused by natural and medical radiation or other environmental exposures.  DNA breaks can also occur when chromosomes exchange genetic material in preparation for cell division. By helping repair mistakes in DNA, the PALB2 and BRCA2 proteins play a critical role in maintaining the stability of a cell's genetic information.  Homozygous PALB2 mutation carriers (individuals with 2 PALB2 gene mutations) have a condition called Fanconi Anemia, type N. Fanconi anemia type N increases the risk of several childhood cancers, including a rare form of kidney cancer called Wilms' tumor and a brain tumor called medulloblastoma. Additionally, people with Fanconi anemia experience bone marrow suppression, which causes an abnormal reduction in the number of red blood cells, white blood cells, and blood platelets made by the bone marrow. The reduced production of red blood cells causes the anemia characteristic of this disorder.  Studies of these families demonstrated increased incident of breast cancer in the mothers (who are heterozygous carriers) of the affected children, thus prompting further evaluation of the relationship between breast cancer and PALB2.  Women who are heterozygous PALB2 carriers have an increase breast cancer risk.  They have up to a 5-fold increased risk of breast cancer over their lifetime. We discussed that [place] is thought to be a moderate risk gene, and to date most of the  literature conveys a risk for breast cancer of about 25-58%.   In families with familial breast cancer that were negative for BRCA1 or BRCA2 genes, approximately 2.7% of women were found to have one PALB2 mutation. PALB2 mutations are the second most common cause of hereditary pancreatic cancer, after BRCA2 mutations.  MEDICAL MANAGEMENT: Women who have a PALB2 mutation have an increased risk for both breast and ovarian cancer.   As discussed with Ms. Moorefield, to reduce the risk for breast cancer, prophylactic bilateral mastectomy is the most effective option. However, for women who choose to keep their breasts, we recommend yearly mammograms, yearly breast MRI, twice-yearly clinical breast exams through a high-risk clinic, and monthly self-breast exams.   RISK REDUCTION: There are several things that can be offered to individuals who are carriers for PALB2 mutations that will reduce the risk for getting cancer.   In individuals who have a prophylactic bilateral salpino-oophorectomy (BSO), the risk for breast cancer is reduced by up to 50%. It has been reported that short term hormone replacement therapy in women undergoing prophylactic BSO does not negate the reduction of breast cancer risk associated with surgery (2.2016 NCCN guidelines).  FAMILY MEMBERS: It is important that all of Ms. Hyland's relatives (both men and women) know of the presence of this gene mutation. Site-specific genetic testing can sort out who in the family is at risk and who is not.   Ms. Alers's children are have a 50% chance to have inherited this mutation. However, her daughters are young and this will not be of any consequence to them for several years. We do not test children because there is no risk to them until they are adults. We recommend they have genetic counseling and testing by the time they are in their early 20s.  Her son is 24. While he is of age to undergo genetic testing, typically there are not any  changes to the medical management of men until the age of 44 when we start screening for prostate cancer.  However, he is eligible for testing and may want to consider genetic testing only to understand his risk for female breast cancer and prostate cancer, as well as risks for his children.  Ms. Doe's siblings have a 50% chance to have inherited this mutation. We recommend they have genetic testing for this same mutation, as identifying the presence of this mutation would allow them to also take advantage of risk-reducing measures.   SUPPORT AND RESOURCES: If Ms. Panning is interested in PALB2-specific information and support, there are two groups, Facing Our Risk (www.facingourrisk.com) and Bright Pink (www.brightpink.org) which some people have found useful. They provide opportunities to speak with other individuals from high-risk families. To locate genetic counselors in other cities, visit the website of the [employer] (ArtistMovie.se) and Secretary/administrator for a Social worker by zip code.  We did provide Ms. Stennett information about the [employer] cancer registry who is looking specifically for families with PALB2 mutations to participate in studies.  Additionally, I provided her information about how to tell children about hereditary mutations and a cell phone app that allows individuals to more easily navigate their screenings.  We encouraged Ms. Mccubbin to remain in contact with [place]  on an annual basis so we can update her personal and family histories, and let her know of advances in cancer genetics that may benefit the family. Our contact number was provided. Ms. Dettmann questions were answered to her satisfaction today, and she knows she is welcome to call anytime with additional questions.   Yannis Gumbs P. Florene Glen, [place], [facility] Certified Genetic Counselor [email or web address] phone: [phone]

## 2015-02-15 ENCOUNTER — Ambulatory Visit
Admission: RE | Admit: 2015-02-15 | Discharge: 2015-02-15 | Disposition: A | Discharge status: Home / Self Care | Payer: Medicaid Other | Source: Ambulatory Visit | Attending: General Surgery | Admitting: General Surgery

## 2015-02-15 ENCOUNTER — Telehealth: Payer: Self-pay | Admitting: Hematology and Oncology

## 2015-02-15 DIAGNOSIS — C50411 Malignant neoplasm of upper-outer quadrant of right female breast: Secondary | ICD-10-CM

## 2015-02-15 NOTE — Telephone Encounter (Signed)
Spoke with patient re appointment for 2/8.

## 2015-02-15 NOTE — H&P (Signed)
Alexandria Richards  Location: [facility] Patient #: [id number] DOB: [birth year] Single / Language: [name] / Race: Black or African American Female       History of Present Illness  Patient words: breast eval.  The patient is a 41 year old female who presents with breast cancer. This is a 22 year old 46 American female who returns with her boyfriend for her second preop visit to discuss definitive surgical planning for her right breast cancer.  She was initially seen in the [facility] on January 19, 2015 by Dr. [name], Dr. Lisbeth Renshaw, and me. Dr. Harlan Stains is her PCP. Juluis Mire is her nurse practitioner.  She felt a lump in the upper outer quadrant of the right breast. Imaging studies showed a 1.7 cm mass with calcifications in the right breast upper outer quadrant 6 cm from the nipple. 1 cm inferior to this a second area of calcifications. Ultrasound showed a suggestive multiple abnormal lymph nodes.  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 Biopsy of the right axillary lymph node showed benign lymph node tissue.  Family history reveals a maternal aunt with breast cancer in her 42s, survived that. No other family history.  Minimal comorbidities. Multiparity. Back pain. Depression.  Social history reveals that she lives with her boyfriend. Her boyfriend is with her today. She is an active smoker. 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  She had MRI of the breast recently. This shows a 2.8 cm area (larger than predicted by [facility]) of enhancement in the high axillary tail of the right breast consistent with her invasive cancer. There were prominent bilateral axillary lymph nodes and some patchy densities in her lung suggesting either sarcoidosis or lymphoproliferative disorder and chest CT was recommended. There is no other mass in either breast.  She's had genetic testing but  that is pending. She is strongly motivated to proceed with surgery and would like to have a lumpectomy and breast conservation. I told her that was reasonable. She understands the implications of genetic mutations and does not really want to wait for that. She doesn't think that would change her decision.  Were going to schedule her for a CT chest early next week. We will be scheduling her for right partial mastectomy with radioactive seed localization and right axillary sentinel node biopsy. I discussed the indications, details, techniques, and numerous risk of the surgery with her and her boyfriend. She is aware of the risk of bleeding, infection, reoperation for positive margins or positive nodes, cosmetic deformity, nerve damage with chronic pain, small risk of arm swelling or arm numbness. Small risk that I will place a drain. She understands all of these issues well. This time all of her questions were answered. She agrees with this plan.   Addendum Note(Tierra Thoma M. 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 This increases lifetime risk 5-9 times general population. Genetic Counsellor to discuss with patient.  Addendum Note(Brighton Pilley M. Dalbert Batman MD; 02/09/2015 3:14 PM) I called the patient today and discussed her CT scan with her. No evidence of mediastinal or hilar adenopathy. Borderline axillary adenopathy. Vague groundglass opacities suggesting atypical or viral pneumonia. She states that she has had a cough lately and everybody in the house has it. She says she is going to see her primary care physician. She missed her genetics counseling appointment but is going to reschedule that. 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 She wants to proceed with lumpectomy and sentinel node biopsy and does not seem interested in any more aggressive surgery at this time.   Other Problems  Anxiety Disorder Back  Pain Depression Lump In Breast  Past Surgical History  Breast Biopsy Right. Oral Surgery  Diagnostic Studies History  Colonoscopy never Mammogram within last year Pap Smear 1-5 years ago  Allergies  No Known Drug Allergies  Medication History  Tylenol (325MG Tablet, Oral as needed) Active. Flexeril (10MG Tablet, Oral) Active. Lexapro (10MG Tablet, Oral) Active. Prazosin HCl (1MG Capsule, Oral) Active. Prenatal M (Oral) Active. Medications Reconciled  Social History  Alcohol use Occasional alcohol use. Caffeine use Carbonated beverages, Coffee. Illicit drug use Uses socially only. Tobacco use Former smoker.  Family History  Breast Cancer Family Members In General. Depression Mother, Sister. Respiratory Condition Father.  Pregnancy / Birth History Age at menarche 36 years, 33 years. Contraceptive History Depo-provera, Oral contraceptives. Gravida 3 Maternal age 39-20 Para 3 Regular periods    Review of Systems  General Present- Appetite Loss and Fatigue. Not Present- Chills, Fever, Night Sweats, Weight Gain and Weight Loss. Skin Not Present- Change in Wart/Mole, Dryness, Hives, Jaundice, New Lesions, Non-Healing Wounds, Rash and Ulcer. HEENT Present- Sinus Pain and Wears glasses/contact lenses. Not Present- Earache, Hearing Loss, Hoarseness, Nose Bleed, Oral Ulcers, Ringing in the Ears, Seasonal Allergies, Sore Throat, Visual Disturbances and Yellow Eyes. Breast Present- Breast Mass and Breast Pain. Not Present- Nipple Discharge and Skin Changes. Cardiovascular Not Present- Chest Pain, Difficulty Breathing Lying Down, Leg Cramps, Palpitations, Rapid Heart Rate, Shortness of Breath and Swelling of Extremities. Gastrointestinal Not Present- Abdominal Pain, Bloating, Bloody Stool, Change in Bowel Habits, Chronic diarrhea, Constipation, Difficulty Swallowing, Excessive gas, Gets full quickly at meals, Hemorrhoids, Indigestion, Nausea, Rectal  Pain and Vomiting. Female Genitourinary Present- Urgency. Not Present- Frequency, Nocturia, Painful Urination and Pelvic Pain. Musculoskeletal Present- Back Pain. Not Present- Joint Pain, Joint Stiffness, Muscle Pain, Muscle Weakness and Swelling of Extremities. Neurological Present- Headaches. Not Present- Decreased Memory, Fainting, Numbness, Seizures, Tingling, Tremor, Trouble walking and Weakness. Psychiatric Present- Anxiety and Depression. Not Present- Bipolar, Change in Sleep Pattern, Fearful and Frequent crying. Endocrine Not Present- Cold Intolerance, Excessive Hunger, Hair Changes, Heat Intolerance, Hot flashes and New Diabetes. Hematology Not Present- Easy Bruising, Excessive bleeding, Gland problems, HIV and Persistent Infections.  Vitals   Weight: 111.8 lb Height: 62in Body Surface Area: 1.49 m Body Mass Index: 20.45 kg/m  Temp.: 17F(Temporal)  Pulse: 76 (Regular)  BP: 126/80 (Sitting, Left Arm, Standard)       Physical Exam  General Mental Status-Alert. General Appearance-Not in acute distress. Build & Nutrition-Well nourished. Posture-Normal posture. Gait-Normal.  Head and Neck Head-normocephalic, atraumatic with no lesions or palpable masses. Trachea-midline. Thyroid Gland Characteristics - normal size and consistency and no palpable nodules.  Chest and Lung Exam Chest and lung exam reveals -on auscultation, normal breath sounds, no adventitious sounds and normal vocal resonance.  Breast Note: Breasts are relatively small, 34A per patient. Palpable mass in the upper outer quadrant near the lateral border of the pectoralis major muscle. Mobile. I only feel one mass. Slightly enlarged lymph nodes on both sides. No adenopathy in the neck. No arm swelling. Nipple areolar complex is normal.   Cardiovascular Cardiovascular examination reveals -normal heart sounds, regular rate and rhythm with no murmurs and femoral artery  auscultation bilaterally reveals normal pulses, no bruits, no thrills.  Abdomen Inspection Inspection of the abdomen reveals -  No Hernias. Palpation/Percussion Palpation and Percussion of the abdomen reveal - Soft, Non Tender, No Rigidity (guarding), No hepatosplenomegaly and No Palpable abdominal masses.  Neurologic Neurologic evaluation reveals -alert and oriented x 3 with no impairment of recent or remote memory, normal attention span and ability to concentrate, normal sensation and normal coordination.  Musculoskeletal Normal Exam - Bilateral-Upper Extremity Strength Normal and Lower Extremity Strength Normal.     Assessment & Plan  BREAST CANCER OF UPPER-OUTER QUADRANT OF RIGHT FEMALE BREAST (C50.411) Current Plans   Your breast MRI shows that you have a solitary cancer in the high upper outer quadrant of the right breast. The rest of the right breast in the left breast looked normal. Your lymph nodes are slightly enlarged under both arms. There is some patchy density in your lungs.  You are a good candidate for lumpectomy, and U state that it is you're strong preference to have a breast conserving surgery. That is a very reasonable option You have had genetic testing and that will take about 3 weeks to get that back. You stated you did not want to wait for that test to come back to proceed with surgery week. That is reasonable.  To evaluate the abnormal lymph nodes and lungs, you'll be scheduled for a CT scan of the chest next week  You will be scheduled for right partial lumpectomy with radioactive seed localization and right axillary sentinel lymph node biopsy In the near future. Dr. Dalbert Batman has discussed the indications, techniques, and numerous risk of the surgery in detail. We've discussed the radioactive seed placement as well as the radioactive liquid chemical injection. It will take about 10 days for the Oncotype test to come back after the surgery  TOBACCO ABUSE  (Z72.0) LYMPHADENOPATHY, THORACIC (R59.0)    Edsel Petrin. Dalbert Batman, M.D., [facility] General and Minimally invasive Surgery Breast and Colorectal Surgery Office:   [phone] Pager:   [phone]

## 2015-02-16 ENCOUNTER — Ambulatory Visit
Admission: RE | Admit: 2015-02-16 | Discharge: 2015-02-16 | Disposition: A | Discharge status: Home / Self Care | Payer: Medicaid Other | Source: Ambulatory Visit | Attending: General Surgery | Admitting: General Surgery

## 2015-02-16 ENCOUNTER — Ambulatory Visit (HOSPITAL_BASED_OUTPATIENT_CLINIC_OR_DEPARTMENT_OTHER): Payer: Medicaid Other | Admitting: Anesthesiology

## 2015-02-16 ENCOUNTER — Ambulatory Visit (HOSPITAL_BASED_OUTPATIENT_CLINIC_OR_DEPARTMENT_OTHER)
Admission: RE | Admit: 2015-02-16 | Discharge: 2015-02-16 | Disposition: A | Discharge status: Home / Self Care | Payer: Medicaid Other | Source: Ambulatory Visit | Attending: General Surgery | Admitting: General Surgery

## 2015-02-16 ENCOUNTER — Encounter (HOSPITAL_BASED_OUTPATIENT_CLINIC_OR_DEPARTMENT_OTHER)
Admission: RE | Disposition: A | Discharge status: Home / Self Care | Payer: Self-pay | Source: Ambulatory Visit | Attending: General Surgery

## 2015-02-16 ENCOUNTER — Encounter (HOSPITAL_BASED_OUTPATIENT_CLINIC_OR_DEPARTMENT_OTHER): Payer: Self-pay | Admitting: *Deleted

## 2015-02-16 ENCOUNTER — Ambulatory Visit (HOSPITAL_COMMUNITY)
Admission: RE | Admit: 2015-02-16 | Discharge: 2015-02-16 | Disposition: A | Discharge status: Home / Self Care | Payer: Medicaid Other | Source: Ambulatory Visit | Attending: General Surgery | Admitting: General Surgery

## 2015-02-16 DIAGNOSIS — Z803 Family history of malignant neoplasm of breast: Secondary | ICD-10-CM | POA: Diagnosis not present

## 2015-02-16 DIAGNOSIS — C50411 Malignant neoplasm of upper-outer quadrant of right female breast: Secondary | ICD-10-CM | POA: Diagnosis present

## 2015-02-16 DIAGNOSIS — Z170 Estrogen receptor positive status [ER+]: Secondary | ICD-10-CM | POA: Insufficient documentation

## 2015-02-16 DIAGNOSIS — C773 Secondary and unspecified malignant neoplasm of axilla and upper limb lymph nodes: Secondary | ICD-10-CM | POA: Diagnosis not present

## 2015-02-16 DIAGNOSIS — F329 Major depressive disorder, single episode, unspecified: Secondary | ICD-10-CM | POA: Diagnosis not present

## 2015-02-16 DIAGNOSIS — Z87891 Personal history of nicotine dependence: Secondary | ICD-10-CM | POA: Insufficient documentation

## 2015-02-16 HISTORY — PX: RADIOACTIVE SEED GUIDED PARTIAL MASTECTOMY WITH AXILLARY SENTINEL LYMPH NODE BIOPSY: SHX6520

## 2015-02-16 SURGERY — RADIOACTIVE SEED GUIDED PARTIAL MASTECTOMY WITH AXILLARY SENTINEL LYMPH NODE BIOPSY
Anesthesia: General | Site: Breast | Laterality: Right

## 2015-02-16 MED ORDER — HYDROMORPHONE HCL 1 MG/ML IJ SOLN
0.2500 mg | INTRAMUSCULAR | Status: DC | PRN
  Administered 2015-02-16 (×3): 0.5 mg via INTRAVENOUS

## 2015-02-16 MED ORDER — PROPOFOL 10 MG/ML IV BOLUS
INTRAVENOUS | Status: DC | PRN
  Administered 2015-02-16: 150 mg via INTRAVENOUS
  Administered 2015-02-16: 100 mg via INTRAVENOUS
  Administered 2015-02-16: 50 mg via INTRAVENOUS

## 2015-02-16 MED ORDER — GLYCOPYRROLATE 0.2 MG/ML IJ SOLN: 0.2000 mg | Freq: Once | INTRAMUSCULAR | Status: DC | PRN

## 2015-02-16 MED ORDER — CHLORHEXIDINE GLUCONATE 4 % EX LIQD: 1.0000 "application " | Freq: Once | CUTANEOUS | Status: DC

## 2015-02-16 MED ORDER — ONDANSETRON HCL 4 MG/2ML IJ SOLN
INTRAMUSCULAR | Status: AC
  Filled 2015-02-16: qty 2

## 2015-02-16 MED ORDER — MIDAZOLAM HCL 2 MG/2ML IJ SOLN
1.0000 mg | INTRAMUSCULAR | Status: DC | PRN
  Administered 2015-02-16: 2 mg via INTRAVENOUS

## 2015-02-16 MED ORDER — LIDOCAINE HCL (CARDIAC) 20 MG/ML IV SOLN
INTRAVENOUS | Status: DC | PRN
  Administered 2015-02-16: 60 mg via INTRAVENOUS

## 2015-02-16 MED ORDER — DEXAMETHASONE SODIUM PHOSPHATE 4 MG/ML IJ SOLN
INTRAMUSCULAR | Status: DC | PRN
  Administered 2015-02-16: 10 mg via INTRAVENOUS

## 2015-02-16 MED ORDER — HYDROMORPHONE HCL 1 MG/ML IJ SOLN
INTRAMUSCULAR | Status: AC
  Filled 2015-02-16: qty 1

## 2015-02-16 MED ORDER — LACTATED RINGERS IV SOLN
INTRAVENOUS | Status: DC
  Administered 2015-02-16 (×2): via INTRAVENOUS

## 2015-02-16 MED ORDER — FENTANYL CITRATE (PF) 100 MCG/2ML IJ SOLN
INTRAMUSCULAR | Status: AC
  Filled 2015-02-16: qty 2

## 2015-02-16 MED ORDER — ONDANSETRON 8 MG PO TBDP
8.0000 mg | ORAL_TABLET | Freq: Once | ORAL | Status: AC
  Administered 2015-02-16: 8 mg via ORAL

## 2015-02-16 MED ORDER — SODIUM CHLORIDE 0.9 % IJ SOLN
INTRAMUSCULAR | Status: DC | PRN
  Administered 2015-02-16: 5 mL

## 2015-02-16 MED ORDER — BUPIVACAINE-EPINEPHRINE (PF) 0.5% -1:200000 IJ SOLN
INTRAMUSCULAR | Status: DC | PRN
  Administered 2015-02-16: 25 mL via PERINEURAL

## 2015-02-16 MED ORDER — TECHNETIUM TC 99M SULFUR COLLOID FILTERED
1.0000 | Freq: Once | INTRAVENOUS | Status: AC | PRN
  Administered 2015-02-16: 1 via INTRADERMAL

## 2015-02-16 MED ORDER — FENTANYL CITRATE (PF) 100 MCG/2ML IJ SOLN
50.0000 ug | INTRAMUSCULAR | Status: AC | PRN
  Administered 2015-02-16: 50 ug via INTRAVENOUS
  Administered 2015-02-16: 100 ug via INTRAVENOUS
  Administered 2015-02-16 (×3): 50 ug via INTRAVENOUS

## 2015-02-16 MED ORDER — MIDAZOLAM HCL 2 MG/2ML IJ SOLN
INTRAMUSCULAR | Status: AC
  Filled 2015-02-16: qty 2

## 2015-02-16 MED ORDER — ONDANSETRON 8 MG PO TBDP
ORAL_TABLET | ORAL | Status: AC
  Filled 2015-02-16: qty 1

## 2015-02-16 MED ORDER — BUPIVACAINE-EPINEPHRINE 0.5% -1:200000 IJ SOLN
INTRAMUSCULAR | Status: DC | PRN
  Administered 2015-02-16: 7 mL

## 2015-02-16 MED ORDER — CEFAZOLIN SODIUM-DEXTROSE 2-3 GM-% IV SOLR
2.0000 g | INTRAVENOUS | Status: AC
  Administered 2015-02-16: 2 g via INTRAVENOUS

## 2015-02-16 MED ORDER — HYDROCODONE-ACETAMINOPHEN 5-325 MG PO TABS: 1.0000 | ORAL_TABLET | Freq: Four times a day (QID) | ORAL | Status: DC | PRN

## 2015-02-16 MED ORDER — OXYCODONE HCL 5 MG/5ML PO SOLN: 5.0000 mg | Freq: Once | ORAL | Status: DC | PRN

## 2015-02-16 MED ORDER — DEXAMETHASONE SODIUM PHOSPHATE 10 MG/ML IJ SOLN
INTRAMUSCULAR | Status: AC
  Filled 2015-02-16: qty 1

## 2015-02-16 MED ORDER — SCOPOLAMINE 1 MG/3DAYS TD PT72: 1.0000 | MEDICATED_PATCH | Freq: Once | TRANSDERMAL | Status: DC | PRN

## 2015-02-16 MED ORDER — OXYCODONE HCL 5 MG PO TABS: 5.0000 mg | ORAL_TABLET | Freq: Once | ORAL | Status: DC | PRN

## 2015-02-16 MED ORDER — MEPERIDINE HCL 25 MG/ML IJ SOLN: 6.2500 mg | INTRAMUSCULAR | Status: DC | PRN

## 2015-02-16 MED ORDER — ONDANSETRON HCL 4 MG/2ML IJ SOLN
INTRAMUSCULAR | Status: DC | PRN
  Administered 2015-02-16: 4 mg via INTRAVENOUS

## 2015-02-16 MED ORDER — PROPOFOL 10 MG/ML IV BOLUS
INTRAVENOUS | Status: AC
  Filled 2015-02-16: qty 20

## 2015-02-16 MED ORDER — CEFAZOLIN SODIUM-DEXTROSE 2-3 GM-% IV SOLR
INTRAVENOUS | Status: AC
  Filled 2015-02-16: qty 50

## 2015-02-16 MED FILL — HYDROCODON-APAP 5-325: 5-325 | 4 days supply | Qty: 30 | Fill #0

## 2015-02-16 SURGICAL SUPPLY — 61 items
APPLIER CLIP 9.375 MED OPEN (MISCELLANEOUS) ×3
BENZOIN TINCTURE PRP APPL 2/3 (GAUZE/BANDAGES/DRESSINGS) IMPLANT
BINDER BREAST LRG (GAUZE/BANDAGES/DRESSINGS) IMPLANT
BINDER BREAST MEDIUM (GAUZE/BANDAGES/DRESSINGS) ×3 IMPLANT
BINDER BREAST XLRG (GAUZE/BANDAGES/DRESSINGS) IMPLANT
BINDER BREAST XXLRG (GAUZE/BANDAGES/DRESSINGS) IMPLANT
BLADE HEX COATED 2.75 (ELECTRODE) ×3 IMPLANT
BLADE SURG 10 STRL SS (BLADE) IMPLANT
BLADE SURG 15 STRL LF DISP TIS (BLADE) ×1 IMPLANT
BLADE SURG 15 STRL SS (BLADE) ×2
CANISTER SUC SOCK COL 7IN (MISCELLANEOUS) IMPLANT
CANISTER SUCT 1200ML W/VALVE (MISCELLANEOUS) ×3 IMPLANT
CHLORAPREP W/TINT 26ML (MISCELLANEOUS) ×3 IMPLANT
CLIP APPLIE 9.375 MED OPEN (MISCELLANEOUS) ×1 IMPLANT
CLOSURE WOUND 1/2 X4 (GAUZE/BANDAGES/DRESSINGS)
COVER BACK TABLE 60X90IN (DRAPES) ×3 IMPLANT
COVER MAYO STAND STRL (DRAPES) ×3 IMPLANT
COVER PROBE W GEL 5X96 (DRAPES) ×3 IMPLANT
DECANTER SPIKE VIAL GLASS SM (MISCELLANEOUS) IMPLANT
DERMABOND ADVANCED (GAUZE/BANDAGES/DRESSINGS) ×2
DERMABOND ADVANCED .7 DNX12 (GAUZE/BANDAGES/DRESSINGS) ×1 IMPLANT
DEVICE DUBIN W/COMP PLATE 8390 (MISCELLANEOUS) ×3 IMPLANT
DRAPE LAPAROSCOPIC ABDOMINAL (DRAPES) ×3 IMPLANT
DRAPE UTILITY XL STRL (DRAPES) ×3 IMPLANT
DRSG PAD ABDOMINAL 8X10 ST (GAUZE/BANDAGES/DRESSINGS) IMPLANT
ELECT REM PT RETURN 9FT ADLT (ELECTROSURGICAL) ×3
ELECTRODE REM PT RTRN 9FT ADLT (ELECTROSURGICAL) ×1 IMPLANT
GAUZE SPONGE 4X4 12PLY STRL (GAUZE/BANDAGES/DRESSINGS) IMPLANT
GLOVE BIOGEL PI IND STRL 7.0 (GLOVE) ×2 IMPLANT
GLOVE BIOGEL PI INDICATOR 7.0 (GLOVE) ×4
GLOVE ECLIPSE 6.5 STRL STRAW (GLOVE) ×6 IMPLANT
GLOVE EUDERMIC 7 POWDERFREE (GLOVE) ×6 IMPLANT
GOWN STRL REUS W/ TWL LRG LVL3 (GOWN DISPOSABLE) ×2 IMPLANT
GOWN STRL REUS W/ TWL XL LVL3 (GOWN DISPOSABLE) ×1 IMPLANT
GOWN STRL REUS W/TWL LRG LVL3 (GOWN DISPOSABLE) ×4
GOWN STRL REUS W/TWL XL LVL3 (GOWN DISPOSABLE) ×2
KIT MARKER MARGIN INK (KITS) ×3 IMPLANT
NDL SAFETY ECLIPSE 18X1.5 (NEEDLE) ×1 IMPLANT
NEEDLE HYPO 18GX1.5 SHARP (NEEDLE) ×2
NEEDLE HYPO 25X1 1.5 SAFETY (NEEDLE) ×6 IMPLANT
NS IRRIG 1000ML POUR BTL (IV SOLUTION) ×3 IMPLANT
PACK BASIN DAY SURGERY FS (CUSTOM PROCEDURE TRAY) ×3 IMPLANT
PENCIL BUTTON HOLSTER BLD 10FT (ELECTRODE) ×3 IMPLANT
SHEET MEDIUM DRAPE 40X70 STRL (DRAPES) IMPLANT
SLEEVE SCD COMPRESS KNEE MED (MISCELLANEOUS) ×3 IMPLANT
SPONGE LAP 18X18 X RAY DECT (DISPOSABLE) IMPLANT
SPONGE LAP 4X18 X RAY DECT (DISPOSABLE) ×3 IMPLANT
STRIP CLOSURE SKIN 1/2X4 (GAUZE/BANDAGES/DRESSINGS) IMPLANT
SUT MNCRL AB 4-0 PS2 18 (SUTURE) ×3 IMPLANT
SUT SILK 2 0 SH (SUTURE) ×3 IMPLANT
SUT VIC AB 2-0 CT1 27 (SUTURE)
SUT VIC AB 2-0 CT1 TAPERPNT 27 (SUTURE) IMPLANT
SUT VIC AB 3-0 SH 27 (SUTURE)
SUT VIC AB 3-0 SH 27X BRD (SUTURE) IMPLANT
SUT VICRYL 3-0 CR8 SH (SUTURE) ×3 IMPLANT
SYRINGE 10CC LL (SYRINGE) ×6 IMPLANT
TOWEL OR 17X24 6PK STRL BLUE (TOWEL DISPOSABLE) ×3 IMPLANT
TOWEL OR NON WOVEN STRL DISP B (DISPOSABLE) ×3 IMPLANT
TUBE CONNECTING 20'X1/4 (TUBING) ×1
TUBE CONNECTING 20X1/4 (TUBING) ×2 IMPLANT
YANKAUER SUCT BULB TIP NO VENT (SUCTIONS) ×3 IMPLANT

## 2015-02-16 NOTE — Discharge Instructions (Signed)
[facility] °Office Phone Number [phone] ° °BREAST BIOPSY/ PARTIAL MASTECTOMY: POST OP INSTRUCTIONS ° °Always review your discharge instruction sheet given to you by the facility where your surgery was performed. ° °IF YOU HAVE DISABILITY OR FAMILY LEAVE FORMS, YOU MUST BRING THEM TO THE OFFICE FOR PROCESSING.  DO NOT GIVE THEM TO YOUR DOCTOR. ° °1. A prescription for pain medication may be given to you upon discharge.  Take your pain medication as prescribed, if needed.  If narcotic pain medicine is not needed, then you may take acetaminophen (Tylenol) or ibuprofen (Advil) as needed. °2. Take your usually prescribed medications unless otherwise directed °3. If you need a refill on your pain medication, please contact your pharmacy.  They will contact our office to request authorization.  Prescriptions will not be filled after 5pm or on week-ends. °4. You should eat very light the first 24 hours after surgery, such as soup, crackers, pudding, etc.  Resume your normal diet the day after surgery. °5. Most patients will experience some swelling and bruising in the breast.  Ice packs and a good support bra will help.  Swelling and bruising can take several days to resolve.  °6. It is common to experience some constipation if taking pain medication after surgery.  Increasing fluid intake and taking a stool softener will usually help or prevent this problem from occurring.  A mild laxative (Milk of Magnesia or Miralax) should be taken according to package directions if there are no bowel movements after 48 hours. °7. Unless discharge instructions indicate otherwise, you may remove your bandages 24-48 hours after surgery, and you may shower at that time.  You may have steri-strips (small skin tapes) in place directly over the incision.  These strips should be left on the skin for 7-10 days.  If your surgeon used skin glue on the incision, you may shower in 24 hours.  The glue will flake off over the  next 2-3 weeks.  Any sutures or staples will be removed at the office during your follow-up visit. °8. ACTIVITIES:  You may resume regular daily activities (gradually increasing) beginning the next day.  Wearing a good support bra or sports bra minimizes pain and swelling.  You may have sexual intercourse when it is comfortable. °a. You may drive when you no longer are taking prescription pain medication, you can comfortably wear a seatbelt, and you can safely maneuver your car and apply brakes. °b. RETURN TO WORK:  ______________________________________________________________________________________ °9. You should see your doctor in the office for a follow-up appointment approximately two weeks after your surgery.  Your doctor’s nurse will typically make your follow-up appointment when she calls you with your pathology report.  Expect your pathology report 2-3 business days after your surgery.  You may call to check if you do not hear from us after three days. °10. OTHER INSTRUCTIONS: _______________________________________________________________________________________________ _____________________________________________________________________________________________________________________________________ °_____________________________________________________________________________________________________________________________________ °_____________________________________________________________________________________________________________________________________ ° °WHEN TO CALL YOUR DOCTOR: °1. Fever over 101.0 °2. Nausea and/or vomiting. °3. Extreme swelling or bruising. °4. Continued bleeding from incision. °5. Increased pain, redness, or drainage from the incision. ° °The clinic staff is available to answer your questions during regular business hours.  Please don’t hesitate to call and ask to speak to one of the nurses for clinical concerns.  If you have a medical emergency, go to the nearest  emergency room or call 911.  A surgeon from [facility] is always on call at the hospital. ° °For further questions, please visit [email or web address]  ° ° ° °  Post Anesthesia Home Care Instructions ° °Activity: °Get plenty of rest for the remainder of the day. A responsible adult should stay with you for 24 hours following the procedure.  °For the next 24 hours, DO NOT: °-Drive a car °-Operate machinery °-Drink alcoholic beverages °-Take any medication unless instructed by your physician °-Make any legal decisions or sign important papers. ° °Meals: °Start with liquid foods such as gelatin or soup. Progress to regular foods as tolerated. Avoid greasy, spicy, heavy foods. If nausea and/or vomiting occur, drink only clear liquids until the nausea and/or vomiting subsides. Call your physician if vomiting continues. ° °Special Instructions/Symptoms: °Your throat may feel dry or sore from the anesthesia or the breathing tube placed in your throat during surgery. If this causes discomfort, gargle with warm salt water. The discomfort should disappear within 24 hours. ° °If you had a scopolamine patch placed behind your ear for the management of post- operative nausea and/or vomiting: ° °1. The medication in the patch is effective for 72 hours, after which it should be removed.  Wrap patch in a tissue and discard in the trash. Wash hands thoroughly with soap and water. °2. You may remove the patch earlier than 72 hours if you experience unpleasant side effects which may include dry mouth, dizziness or visual disturbances. °3. Avoid touching the patch. Wash your hands with soap and water after contact with the patch. °  ° °

## 2015-02-16 NOTE — Anesthesia Postprocedure Evaluation (Signed)
Anesthesia Post Note  Patient: Alexandria Richards  Procedure(s) Performed: Procedure(s) (LRB): RIGHT BREAST LUMPECTOMY WITH RADIOACTIVE SEED AND RIGHT AXILLARY SENTINEL LYMPH NODE BIOPSY (Right)  Patient location during evaluation: PACU Anesthesia Type: General Level of consciousness: awake and alert Pain management: pain level controlled Vital Signs Assessment: post-procedure vital signs reviewed and stable Respiratory status: spontaneous breathing, nonlabored ventilation and respiratory function stable Cardiovascular status: blood pressure returned to baseline and stable Postop Assessment: no signs of nausea or vomiting Anesthetic complications: no    Last Vitals:  Filed Vitals:   02/16/15 1415 02/16/15 1430  BP: 135/83 114/93  Pulse: 77 75  Temp:    Resp: 18 22    Last Pain:  Filed Vitals:   02/16/15 1441  PainSc: 5                  Xaiver Roskelley A

## 2015-02-16 NOTE — Progress Notes (Signed)
Patient sedated from pain meds given in PACU.  Boyfriend Terie Purser went to [facility] to get RX filled.  Giving patient the rest of IV fluids since she had some nausea with movement.

## 2015-02-16 NOTE — Op Note (Signed)
Patient Name:           Alexandria Richards   Date of Surgery:        02/16/2015  Pre op Diagnosis:      Invasive ductal carcinoma right breast, upper outer quadrant, receptor positive, HER-2 negative.  Clinical stage T2, N0                                      PALB2 genetic mutation  Post op Diagnosis:    Same  Procedure:                 Inject blue dye right breast, right partial mastectomy with radioactive seed localization, right axillary sentinel lymph node biopsy  Surgeon:                     Edsel Petrin. Dalbert Batman, M.D., FACS  Assistant:                      OR staff  Operative Indications:   The patient is a 40 year old female who presents with breast cancer.     She was initially seen in the [facility] on January 19, 2015 by Dr. [name], Dr. Lisbeth Renshaw, and me. Dr. Harlan Stains is her PCP. Juluis Mire is her nurse practitioner.      She felt a lump in the upper outer quadrant of the right breast. Imaging studies showed a 1.7 cm mass with calcifications in the right breast upper outer quadrant 6 cm from the nipple. 1 cm inferior to this a second area of calcifications. Ultrasound showed a suggestive multiple abnormal lymph nodes.       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 Biopsy of the right axillary lymph node showed benign lymph node tissue.     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     She had MRI of the breast  which shows a 2.8 cm area (larger than predicted by [facility]) of enhancement in the high axillary tail of the right breast consistent with her invasive cancer. There were prominent bilateral axillary lymph nodes and some patchy densities in her lung suggesting either sarcoidosis or lymphoproliferative disorder      She is strongly motivated to proceed with surgery and would like to have a lumpectomy and breast conservation regardless of the results of her genetic tests.. .  Operative Findings:        The mass in the upper outer quadrant of the right breast was overlying the lateral border of the pectoralis major muscle.  It was at least 2 cm in diameter by palpation.  It contained the radioactive seed.  The breast are small and thin and so I took a generous ellipse of skin to protect the anterior margin entered the posterior margin all the way down to include the pectoralis fashion.  I took a generous amount of breast tissue medially and inferiorly.  I found 6 sentinel lymph nodes.  Because the tumor and the incision were high I was able to do all of the surgery through a single incision  Procedure in Detail:          I was able to hear the audible signal of the radioactive seed in the holding area.  The patient underwent pectoral block by Dr. Al Corpus.  The patient underwent injection of  technetium radionuclide by the nuclear medicine technician.  The patient was taken to the operating room and underwent general anesthesia with LMA device.  Surgical timeout was performed.  Intravenous antibiotics were given.  Following alcohol prep I injected 5 mL of blue dye into the right breast, subareolar area.  This was methylene blue mixed with saline and the breast was massaged for a few minutes.     I marked a curvilinear crescent-shaped incision almost 2 cm wide at the widest point.  This was in the upper outer quadrant of the right breast.  I then made this incision with the knife.  I generously dissected down widely around the breast tissues to the pectoralis fascia.  I marked the specimen with silk sutures and dissected the specimen off of the pectoralis fascia.  The specimen was marked with a 6 color ink kits.  Specimen mammogram looked good at the seed and the clip were within the specimen.  I then dissected up into the axilla using the neoprobe and found 6 sentinel lymph nodes.  All of these were hot but only a couple of these were blue.  Hemostasis was excellent and achieved electrocautery.  The wound was  irrigated with saline.  I marked the lumpectomy cavity with metal clips.  I closed the breast tissue with multiple interrupted sutures of 3-0 Vicryl tacking the breast tissue and skin flaps down to the pectoralis muscle to try to avoid seroma.  The skin was closed with a running subcuticular 4-0 Monocryl and Dermabond.  Breast binder was placed and the patient taken to PACU in stable condition.  EBL 20 mL.  Counts correct.  Complications none.     Edsel Petrin. Dalbert Batman, M.D., FACS General and Minimally Invasive Surgery Breast and Colorectal Surgery  02/16/2015 1:25 PM

## 2015-02-16 NOTE — Anesthesia Procedure Notes (Addendum)
Anesthesia Regional Block:  Pectoralis block  Pre-Anesthetic Checklist: ,, timeout performed, Correct Patient, Correct Site, Correct Laterality, Correct Procedure, Correct Position, site marked, Risks and benefits discussed,  Surgical consent,  Pre-op evaluation,  At surgeon's request and post-op pain management  Laterality: Right and Upper  Prep: chloraprep       Needles:  Injection technique: Single-shot  Needle Type: Echogenic Needle     Needle Length: 9cm 9 cm Needle Gauge: 21 and 21 G    Additional Needles:  Procedures: ultrasound guided (picture in chart) Pectoralis block Narrative:  Start time: 02/16/2015 11:47 AM End time: 02/16/2015 11:52 AM Injection made incrementally with aspirations every 5 mL.  Performed by: Personally  Anesthesiologist: CREWS, DAVID   Procedure Name: LMA Insertion Date/Time: 02/16/2015 12:09 PM Performed by: Maryella Shivers Pre-anesthesia Checklist: Patient identified, Emergency Drugs available, Suction available and Patient being monitored Patient Re-evaluated:Patient Re-evaluated prior to inductionOxygen Delivery Method: Circle System Utilized Preoxygenation: Pre-oxygenation with 100% oxygen Intubation Type: IV induction Ventilation: Mask ventilation without difficulty LMA: LMA inserted LMA Size: 4.0 Number of attempts: 1 Airway Equipment and Method: Bite block Placement Confirmation: positive ETCO2 Tube secured with: Tape Dental Injury: Teeth and Oropharynx as per pre-operative assessment       Right PEC block image

## 2015-02-16 NOTE — Progress Notes (Signed)
Assisted Dr. Crews with right, ultrasound guided, pectoralis block. Side rails up, monitors on throughout procedure. See vital signs in flow sheet. Tolerated Procedure well. 

## 2015-02-16 NOTE — Transfer of Care (Signed)
Immediate Anesthesia Transfer of Care Note  Patient: Alexandria Richards  Procedure(s) Performed: Procedure(s): RIGHT BREAST LUMPECTOMY WITH RADIOACTIVE SEED AND RIGHT AXILLARY SENTINEL LYMPH NODE BIOPSY (Right)  Patient Location: PACU  Anesthesia Type:GA combined with regional for post-op pain  Level of Consciousness: awake, alert  and oriented  Airway & Oxygen Therapy: Patient Spontanous Breathing and Patient connected to face mask oxygen  Post-op Assessment: Report given to RN and Post -op Vital signs reviewed and stable  Post vital signs: Reviewed and stable  Last Vitals:  Filed Vitals:   02/16/15 1150 02/16/15 1155  BP:  114/77  Pulse: 90 96  Temp:    Resp: 20 13    Complications: No apparent anesthesia complications

## 2015-02-16 NOTE — Anesthesia Preprocedure Evaluation (Addendum)
Anesthesia Evaluation  Patient identified by MRN, date of birth, ID band Patient awake    Reviewed: Allergy & Precautions, NPO status , Patient's Chart, lab work & pertinent test results  Airway Mallampati: I  TM Distance: >3 FB Neck ROM: Full    Dental  (+) Teeth Intact, Dental Advisory Given   Pulmonary former smoker,    breath sounds clear to auscultation       Cardiovascular  Rhythm:Regular Rate:Normal     Neuro/Psych    GI/Hepatic   Endo/Other    Renal/GU      Musculoskeletal   Abdominal   Peds  Hematology   Anesthesia Other Findings   Reproductive/Obstetrics                           Anesthesia Physical Anesthesia Plan  ASA: I  Anesthesia Plan: General   Post-op Pain Management: MAC Combined w/ Regional for Post-op pain   Induction: Intravenous  Airway Management Planned: LMA  Additional Equipment:   Intra-op Plan:   Post-operative Plan: Extubation in OR  Informed Consent: I have reviewed the patients History and Physical, chart, labs and discussed the procedure including the risks, benefits and alternatives for the proposed anesthesia with the patient or authorized representative who has indicated his/her understanding and acceptance.   Dental advisory given  Plan Discussed with: CRNA, Anesthesiologist and Surgeon  Anesthesia Plan Comments:        Anesthesia Quick Evaluation

## 2015-02-16 NOTE — Interval H&P Note (Signed)
History and Physical Interval Note:  02/16/2015 11:39 AM  Alexandria Richards  has presented today for surgery, with the diagnosis of cancer right breast upper outer quadrant  The various methods of treatment have been discussed with the patient and family. After consideration of risks, benefits and other options for treatment, the patient has consented to  Procedure(s): RIGHT BREAST LUMPECTOMY WITH RADIOACTIVE SEED AND RIGHT AXILLARY SENTINEL LYMPH NODE BIOPSY (Right) as a surgical intervention .  The patient's history has been reviewed, patient examined, no change in status, stable for surgery.  I have reviewed the patient's chart and labs.  Questions were answered to the patient's satisfaction.     [name]

## 2015-02-17 ENCOUNTER — Encounter (HOSPITAL_BASED_OUTPATIENT_CLINIC_OR_DEPARTMENT_OTHER): Payer: Self-pay | Admitting: General Surgery

## 2015-02-22 NOTE — Assessment & Plan Note (Addendum)
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 I discussed the margins as well as lymph node surgeries. We also discussed the final staging along with previously performed ER/PR and HER-2/neu testing.  Plan: 1. Adjuvant chemo with Dose dense AC followed by Abraxane weekly X 12 2. Foll by Adj XRT 3. Foll by Adj Tamoxifen  PREVENT trial: [id number]  Newly diagnosed breast cancer Stages 1-3 scheduled to receive anthracycline randomized to atorvastatin 40 mg or placebo once daily for 24 months along with 3 cardiac MRIs or 2 years paid for by the trial. I discussed the risks and benefits of atorvastatin including the risk of myopathy and elevation of liver function tests. I explained the randomization process and patient is interested in the trial. I provided her with literature to read and provided her with the contact with the clinical trials nurse to ask any questions regarding the trial.  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 It encodes a protein that functions in genome maintenance.  Clinical significance: 1.  Risk of breast cancer:The risk of breast cancer for female PALB2 mutation carriers, as compared with the general population, with 8-9 times as high among those younger than 39 years of age, 6-8 times as high among those 61-10 years of age, and 5 times as high among those older than 39 years of age. The cumulative breast cancer risk among female PALB2 mutation carriers is estimated to be increased by 2-4 fold by age 3, which translates to an 18-35% risk of breast cancer by age 62 based on a general female population risk of 8.8% to age  85.  2.  Risk of pancreatic cancer: The exact cumulative pancreatic cancer risk among PALB2 mutation carriers has not been determined. The magnitude of risk increases with the number of affected relatives, with the highest risk (32-fold) in individuals with three affected first-degree relatives.  3. Risk of Ovarian cancer:  Also not properly documented how much the increased risk is.   Recommendations: 1.  Consideration for prophylactic bilateral mastectomy vs annual breast MRI monitoring 2.  Monitoring of symptoms with pancreatic cancer. There is no clear role of routine scans are CA 19-9 for surveillance 3.  Gynecologist evaluation  and counseling regarding risk of ovarian cancer.

## 2015-02-23 ENCOUNTER — Ambulatory Visit (HOSPITAL_BASED_OUTPATIENT_CLINIC_OR_DEPARTMENT_OTHER): Payer: Medicaid Other | Admitting: Hematology and Oncology

## 2015-02-23 ENCOUNTER — Encounter: Payer: Self-pay | Admitting: *Deleted

## 2015-02-23 ENCOUNTER — Encounter: Payer: Self-pay | Admitting: Hematology and Oncology

## 2015-02-23 ENCOUNTER — Telehealth: Payer: Self-pay | Admitting: *Deleted

## 2015-02-23 VITALS — BP 102/71 | HR 84 | Temp 97.9°F | Resp 18 | Ht 62.0 in | Wt 113.5 lb

## 2015-02-23 DIAGNOSIS — R200 Anesthesia of skin: Secondary | ICD-10-CM

## 2015-02-23 DIAGNOSIS — C50919 Malignant neoplasm of unspecified site of unspecified female breast: Secondary | ICD-10-CM

## 2015-02-23 DIAGNOSIS — C50411 Malignant neoplasm of upper-outer quadrant of right female breast: Secondary | ICD-10-CM

## 2015-02-23 DIAGNOSIS — Z1589 Genetic susceptibility to other disease: Secondary | ICD-10-CM

## 2015-02-23 DIAGNOSIS — M79629 Pain in unspecified upper arm: Secondary | ICD-10-CM | POA: Diagnosis not present

## 2015-02-23 DIAGNOSIS — Z1509 Genetic susceptibility to other malignant neoplasm: Secondary | ICD-10-CM

## 2015-02-23 DIAGNOSIS — Z1502 Genetic susceptibility to malignant neoplasm of ovary: Secondary | ICD-10-CM

## 2015-02-23 NOTE — Telephone Encounter (Signed)
Received order per Dr. [name] for Mammaprint testing. Requisition sent to pathology and [facility].  Requisition received by Tammy.

## 2015-02-23 NOTE — Progress Notes (Signed)
Patient Care Team: Kerin Perna, NP as PCP - General (Internal Medicine) Fanny Skates, MD as Consulting Physician (General Surgery) Nicholas Lose, MD as Consulting Physician (Hematology and Oncology) Kyung Rudd, MD as Consulting Physician (Radiation Oncology) Sylvan Cheese, NP as Nurse Practitioner (Hematology and Oncology)  DIAGNOSIS: Breast cancer of upper-outer quadrant of right female breast [facility])   Staging form: Breast, AJCC 7th Edition     Clinical stage from 01/19/2015: Stage IA (T1c, N0, M0) - Unsigned       Staging comments: Staged at breast conference on 1.4.17  SUMMARY OF ONCOLOGIC HISTORY:   Breast cancer of upper-outer quadrant of right female breast ([place])   12/31/2014 Mammogram Right breast over the palpable mass at 10:30 position: 1.5 x 1.1 x 1.3 cm with multiple abnormal appearing axillary lymph nodes largest 1.6 cm   01/05/2015 Initial Diagnosis Right breast biopsy: IDC grade 2 with DCIS, right axillary lymph node biopsy negative, ER 100%, PR 40%, HER-2 negative, Ki-67 90%   02/16/2015 Surgery Rt Lumpectomy: IDC grade 3, 1.7 cm, with HG DCIS, 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 She is still very sore under the arm. She is here by herself but she connected me with her boyfriend on the speaker phone of her cell phone.  She reports increasing discomfort under the arm and numbness.  REVIEW OF SYSTEMS:   Constitutional: Denies fevers, chills or abnormal weight loss Eyes: Denies blurriness of vision Ears, nose, mouth, throat, and face: Denies mucositis or sore throat Respiratory: Denies cough, dyspnea or wheezes Cardiovascular: Denies palpitation, chest  discomfort Gastrointestinal:  Denies nausea, heartburn or change in bowel habits Skin: Denies abnormal skin rashes Lymphatics: Denies new lymphadenopathy or easy bruising Neurological:Denies numbness, tingling or new weaknesses Behavioral/Psych: Mood is stable, no new changes  Extremities: No lower extremity edema Breast:  Soreness from recent surgery All other systems were reviewed with the patient and are negative.  I have reviewed the past medical history, past surgical history, social history and family history with the patient and they are unchanged from previous note.  ALLERGIES:  has No Known Allergies.  MEDICATIONS:  Current Outpatient Prescriptions  Medication Sig Dispense Refill  . ALPRAZolam (XANAX) 0.25 MG tablet Take 0.25 mg by mouth at bedtime as needed for anxiety.    Marland Kitchen HYDROcodone-acetaminophen (NORCO) 5-325 MG tablet Take 1-2 tablets by mouth every 6 (six) hours as needed for moderate pain or severe pain. 30 tablet 0  . ibuprofen (ADVIL,MOTRIN) 100 MG tablet Take 100 mg by mouth every 6 (six) hours as needed for fever.    . prazosin (MINIPRESS) 1 MG capsule TAKE 1 CAPSULE BY MOUTH AT BEDTIME FOR NIGHTMARES  1  . Prenatal Vit-Fe Fumarate-FA (MULTIVITAMIN-PRENATAL) 27-0.8 MG TABS Take 1 tablet by mouth daily at 12 noon.     No current facility-administered medications for this visit.    PHYSICAL EXAMINATION: ECOG PERFORMANCE STATUS: 1 - Symptomatic but completely ambulatory  Filed Vitals:   02/23/15 1116  BP: 102/71  Pulse: 84  Temp: 97.9 F (36.6 C)  Resp: 18   Filed Weights   02/23/15 1116  Weight: 113 lb 8 oz (51.483 kg)    GENERAL:alert, no distress and comfortable SKIN:  skin color, texture, turgor are normal, no rashes or significant lesions EYES: normal, Conjunctiva are pink and non-injected, sclera clear OROPHARYNX:no exudate, no erythema and lips, buccal mucosa, and tongue normal  NECK: supple, thyroid normal size, non-tender, without  nodularity LYMPH:  no palpable lymphadenopathy in the cervical, axillary or inguinal LUNGS: clear to auscultation and percussion with normal breathing effort HEART: regular rate & rhythm and no murmurs and no lower extremity edema ABDOMEN:abdomen soft, non-tender and normal bowel sounds MUSCULOSKELETAL:no cyanosis of digits and no clubbing  NEURO: alert & oriented x 3 with fluent speech, no focal motor/sensory deficits EXTREMITIES: No lower extremity edema  LABORATORY DATA:  I have reviewed the data as listed   Chemistry      Component Value Date/Time   NA 140 01/19/2015 0854   K 4.3 01/19/2015 0854   CO2 25 01/19/2015 0854   BUN 13.0 01/19/2015 0854   CREATININE 1.0 01/19/2015 0854      Component Value Date/Time   CALCIUM 9.1 01/19/2015 0854   ALKPHOS 53 01/19/2015 0854   AST 19 01/19/2015 0854   ALT 20 01/19/2015 0854   BILITOT 0.32 01/19/2015 0854       Lab Results  Component Value Date   WBC 7.1 01/19/2015   HGB 13.4 01/19/2015   HCT 40.6 01/19/2015   MCV 82.5 01/19/2015   PLT 246 01/19/2015   NEUTROABS 5.4 01/19/2015     ASSESSMENT & PLAN:  Breast cancer of upper-outer quadrant of right female breast ([place]) Rt Lumpectomy 02/16/15: IDC grade 3, 1.7 cm, with HG DCIS, 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 I discussed the margins as well as lymph node surgeries. We also discussed the final staging along with previously performed ER/PR and HER-2/neu testing.  Plan: 1. Adjuvant chemo with Dose dense AC followed by Abraxane weekly X 12 2. Foll by Adj XRT 3. Foll by Adj Tamoxifen/AI  plus ovarian suppression   I spent 30 minutes counseling her regarding chemotherapy. Chemotherapy Counseling: I discussed the risks and benefits of chemotherapy including the risks  of nausea/ vomiting, risk of infection from low WBC count, fatigue due to chemo or anemia, bruising or bleeding due to low platelets, mouth sores, loss/ change in taste and decreased appetite. Liver and kidney function will be monitored through out chemotherapy as abnormalities in liver and kidney function may be a side effect of treatment. Cardiac dysfunction due to Adriamycin was discussed in detail.  Risk of neuropathy from the Abraxane and Risk of permanent bone marrow dysfunction and leukemia due to chemo were also discussed.  Patient is not mentally prepared for chemotherapy. She wishes that we request Mammaprint testing to determine if she is truly high risk. I think clinically patient is high risk that we will send for Mammaprint and will inform her the results so that it can help make her decision regarding chemotherapy.  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 It encodes a protein that functions in genome maintenance.  Clinical significance: 1.  Risk of breast cancer:The risk of breast cancer for female PALB2 mutation carriers, as compared with the general population, with 8-9 times as high among those younger than 39 years of age,  6-8 times as high among those 44-22 years of age, and 5 times as high among those older than 39 years of age. The cumulative breast cancer risk among female PALB2 mutation carriers is estimated to be increased by 2-4 fold by age 27, which translates to an 18-35% risk of breast cancer by age 58 based on a general female population risk of 8.8% to age 10.  2.  Risk of pancreatic cancer: The exact cumulative pancreatic cancer risk among PALB2 mutation carriers has not been determined. The magnitude of risk increases with the number of affected relatives, with the highest risk (32-fold) in individuals with three affected first-degree relatives.  3. Risk of Ovarian cancer:   Also not properly documented how much the increased risk is.   Recommendations: 1.  Consideration for prophylactic bilateral mastectomy vs annual breast MRI monitoring 2.  Monitoring of symptoms with pancreatic cancer. There is no clear role of routine scans are CA 19-9 for surveillance 3.  Gynecologist evaluation  and counseling regarding risk of ovarian cancer.  I believe that she should be evaluated for oophorectomy.  No orders of the defined types were placed in this encounter.   The patient has a good understanding of the overall plan. she agrees with it. she will call with any problems that may develop before the next visit here.   Rulon Eisenmenger, MD 02/23/2015

## 2015-02-23 NOTE — Progress Notes (Signed)
Met with pt after discussion with Dr. [name] concerning future treatment and recommendations for chemotherapy. Pt reluctant to receive chemotherapy. Discussed chemo regimen and port and gave visual example of port. Pt request molecular testing be performed to "help me make my decsion". Discussed this with Dr. [name]. Mammaprint ordered per Dr. [name]. Called pt to inform Dr. [name] has ordered this testing. Encourage pt to call with further needs or questions. Received verbal understanding.

## 2015-03-04 ENCOUNTER — Encounter (HOSPITAL_COMMUNITY): Payer: Self-pay

## 2015-03-07 NOTE — Assessment & Plan Note (Signed)
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  Plan: 1. Adjuvant chemo with Dose dense AC followed by Abraxane weekly X 12 2. Foll by Adj XRT 3. Foll by Adj Tamoxifen/AI plus ovarian suppression  RTC to start chemo

## 2015-03-08 ENCOUNTER — Other Ambulatory Visit: Payer: Self-pay | Admitting: General Surgery

## 2015-03-08 ENCOUNTER — Telehealth: Payer: Self-pay | Admitting: Hematology and Oncology

## 2015-03-08 ENCOUNTER — Other Ambulatory Visit: Payer: Self-pay | Admitting: *Deleted

## 2015-03-08 ENCOUNTER — Ambulatory Visit (HOSPITAL_BASED_OUTPATIENT_CLINIC_OR_DEPARTMENT_OTHER): Payer: Medicaid Other | Admitting: Hematology and Oncology

## 2015-03-08 ENCOUNTER — Encounter: Payer: Self-pay | Admitting: Hematology and Oncology

## 2015-03-08 VITALS — BP 113/68 | HR 104 | Temp 98.3°F | Resp 18 | Wt 114.9 lb

## 2015-03-08 DIAGNOSIS — C50411 Malignant neoplasm of upper-outer quadrant of right female breast: Secondary | ICD-10-CM | POA: Diagnosis not present

## 2015-03-08 DIAGNOSIS — C773 Secondary and unspecified malignant neoplasm of axilla and upper limb lymph nodes: Secondary | ICD-10-CM

## 2015-03-08 DIAGNOSIS — C50919 Malignant neoplasm of unspecified site of unspecified female breast: Secondary | ICD-10-CM

## 2015-03-08 DIAGNOSIS — Z1509 Genetic susceptibility to other malignant neoplasm: Secondary | ICD-10-CM

## 2015-03-08 DIAGNOSIS — Z1589 Genetic susceptibility to other disease: Secondary | ICD-10-CM

## 2015-03-08 DIAGNOSIS — Z1502 Genetic susceptibility to malignant neoplasm of ovary: Secondary | ICD-10-CM

## 2015-03-08 MED ORDER — LORAZEPAM 0.5 MG PO TABS: 0.5000 mg | ORAL_TABLET | Freq: Every day | ORAL | Status: DC

## 2015-03-08 MED ORDER — PROCHLORPERAZINE MALEATE 10 MG PO TABS: 10.0000 mg | ORAL_TABLET | Freq: Four times a day (QID) | ORAL | Status: DC | PRN

## 2015-03-08 MED ORDER — LIDOCAINE-PRILOCAINE 2.5-2.5 % EX CREA: TOPICAL_CREAM | CUTANEOUS | Status: DC

## 2015-03-08 MED ORDER — ONDANSETRON HCL 8 MG PO TABS: 8.0000 mg | ORAL_TABLET | Freq: Two times a day (BID) | ORAL | Status: DC | PRN

## 2015-03-08 MED ORDER — DEXAMETHASONE 4 MG PO TABS: 4.0000 mg | ORAL_TABLET | Freq: Every day | ORAL | Status: DC

## 2015-03-08 NOTE — Telephone Encounter (Signed)
appt made and avs printed °

## 2015-03-08 NOTE — Progress Notes (Signed)
Patient Care Team: Kerin Perna, NP as PCP - General (Internal Medicine) Fanny Skates, MD as Consulting Physician (General Surgery) Nicholas Lose, MD as Consulting Physician (Hematology and Oncology) Kyung Rudd, MD as Consulting Physician (Radiation Oncology) Sylvan Cheese, NP as Nurse Practitioner (Hematology and Oncology)  DIAGNOSIS: Breast cancer of upper-outer quadrant of right female breast [facility])   Staging form: Breast, AJCC 7th Edition     Clinical stage from 01/19/2015: Stage IA (T1c, N0, M0) - Unsigned       Staging comments: Staged at breast conference on 1.4.17  SUMMARY OF ONCOLOGIC HISTORY:   Breast cancer of upper-outer quadrant of right female breast ([place])   12/31/2014 Mammogram Right breast over the palpable mass at 10:30 position: 1.5 x 1.1 x 1.3 cm with multiple abnormal appearing axillary lymph nodes largest 1.6 cm   01/05/2015 Initial Diagnosis Right breast biopsy: IDC grade 2 with DCIS, right axillary lymph node biopsy negative, ER 100%, PR 40%, HER-2 negative, Ki-67 90%   02/16/2015 Surgery Rt Lumpectomy: IDC grade 3, 1.7 cm, with HG DCIS, 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 She underwent mammogram testing and the test results came back as high risk. She is here today to discuss the results of the Mammaprint and to discuss a treatment plan. She is accompanied by her husband today. She continues to have mild discomfort in the right breast with occasional throbbing sensation. Otherwise she has healed quite well from a breast surgery.  REVIEW OF SYSTEMS:     Constitutional: Denies fevers, chills or abnormal weight loss Eyes: Denies blurriness of vision Ears, nose, mouth, throat, and face: Denies mucositis or sore throat Respiratory: Denies cough, dyspnea or wheezes Cardiovascular: Denies palpitation, chest discomfort Gastrointestinal:  Denies nausea, heartburn or change in bowel habits Skin: Denies abnormal skin rashes Lymphatics: Denies new lymphadenopathy or easy bruising Neurological:Denies numbness, tingling or new weaknesses Behavioral/Psych: Mood is stable, no new changes  Extremities: No lower extremity edema Breast: occasional throbbing in the right breast All other systems were reviewed with the patient and are negative.  I have reviewed the past medical history, past surgical history, social history and family history with the patient and they are unchanged from previous note.  ALLERGIES:  has No Known Allergies.  MEDICATIONS:  Current Outpatient Prescriptions  Medication Sig Dispense Refill  . ALPRAZolam (XANAX) 0.25 MG tablet Take 0.25 mg by mouth at bedtime as needed for anxiety.    . Marland KitchenYDROcodone-acetaminophen (NORCO) 5-325 MG tablet Take 1-2 tablets by mouth every 6 (six) hours as needed for moderate pain or severe pain. 30 tablet 0  . ibuprofen (ADVIL,MOTRIN) 100 MG tablet Take 100 mg by mouth every 6 (six) hours as needed for fever.    . prazosin (MINIPRESS) 1 MG capsule TAKE 1 CAPSULE BY MOUTH AT BEDTIME FOR NIGHTMARES  1  . Prenatal Vit-Fe Fumarate-FA (MULTIVITAMIN-PRENATAL) 27-0.8 MG TABS Take 1 tablet by mouth daily at 12 noon.     No current facility-administered medications for this visit.    PHYSICAL EXAMINATION: ECOG PERFORMANCE STATUS: 1 - Symptomatic but completely ambulatory  Filed Vitals:  03/08/15 1431  BP: 113/68  Pulse: 104  Temp: 98.3 F (36.8 C)  Resp: 18   Filed Weights   03/08/15 1431  Weight: 114 lb 14.4 oz (52.118 kg)    GENERAL:alert, no distress and comfortable SKIN: skin color,  texture, turgor are normal, no rashes or significant lesions EYES: normal, Conjunctiva are pink and non-injected, sclera clear OROPHARYNX:no exudate, no erythema and lips, buccal mucosa, and tongue normal  NECK: supple, thyroid normal size, non-tender, without nodularity LYMPH:  no palpable lymphadenopathy in the cervical, axillary or inguinal LUNGS: clear to auscultation and percussion with normal breathing effort HEART: regular rate & rhythm and no murmurs and no lower extremity edema ABDOMEN:abdomen soft, non-tender and normal bowel sounds MUSCULOSKELETAL:no cyanosis of digits and no clubbing  NEURO: alert & oriented x 3 with fluent speech, no focal motor/sensory deficits EXTREMITIES: No lower extremity edema  LABORATORY DATA:  I have reviewed the data as listed   Chemistry      Component Value Date/Time   NA 140 01/19/2015 0854   K 4.3 01/19/2015 0854   CO2 25 01/19/2015 0854   BUN 13.0 01/19/2015 0854   CREATININE 1.0 01/19/2015 0854      Component Value Date/Time   CALCIUM 9.1 01/19/2015 0854   ALKPHOS 53 01/19/2015 0854   AST 19 01/19/2015 0854   ALT 20 01/19/2015 0854   BILITOT 0.32 01/19/2015 0854      Lab Results  Component Value Date   WBC 7.1 01/19/2015   HGB 13.4 01/19/2015   HCT 40.6 01/19/2015   MCV 82.5 01/19/2015   PLT 246 01/19/2015   NEUTROABS 5.4 01/19/2015   ASSESSMENT & PLAN:  Breast cancer of upper-outer quadrant of right female breast ([place]) Rt Lumpectomy 02/16/15: IDC grade 3, 1.7 cm, with HG DCIS, 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  I discussed with the patient about PREVENT trial and she will inform us if she has any interest in this trial.  Plan: 1. Adjuvant chemo with Dose dense AC followed by Abraxane weekly X 12 2. Foll by Adj XRT 3. Foll by Adj Tamoxifen vs anastrozole (after oophorectomy)  RTC  to start chemo in 2 weeks on 03/25/15 Patient will follow up with Heather on 3/10 (cycle 1), 3/17 (nadir count check) and 04/08/2015 (cycle 2) and follow up with me on 04/22/15 with cycle 3  No orders of the defined types were placed in this encounter.   The patient has a good understanding of the overall plan. she agrees with it. she will call with any problems that may develop before the next visit here.   Rulon Eisenmenger, MD 03/08/2015

## 2015-03-10 ENCOUNTER — Encounter (HOSPITAL_BASED_OUTPATIENT_CLINIC_OR_DEPARTMENT_OTHER): Payer: Self-pay | Admitting: *Deleted

## 2015-03-10 ENCOUNTER — Ambulatory Visit
Admission: RE | Admit: 2015-03-10 | Discharge: 2015-03-10 | Disposition: A | Discharge status: Home / Self Care | Payer: Medicaid Other | Source: Ambulatory Visit | Attending: General Surgery | Admitting: General Surgery

## 2015-03-10 ENCOUNTER — Other Ambulatory Visit: Payer: Self-pay | Admitting: General Surgery

## 2015-03-10 ENCOUNTER — Encounter: Payer: Self-pay | Admitting: *Deleted

## 2015-03-10 DIAGNOSIS — Z01818 Encounter for other preprocedural examination: Secondary | ICD-10-CM

## 2015-03-10 DIAGNOSIS — R0981 Nasal congestion: Secondary | ICD-10-CM

## 2015-03-10 DIAGNOSIS — R0982 Postnasal drip: Secondary | ICD-10-CM

## 2015-03-10 HISTORY — DX: Nasal congestion: R09.81

## 2015-03-10 HISTORY — DX: Postnasal drip: R09.82

## 2015-03-10 NOTE — Progress Notes (Signed)
Unable to get in to exam room prior to MD.  No assessment performed.  

## 2015-03-10 NOTE — Progress Notes (Signed)
Ativan Rx faxed to [facility] OP

## 2015-03-10 NOTE — Pre-Procedure Instructions (Signed)
To go to [facility]. Imaging for CXR today before 4:00 PM

## 2015-03-10 NOTE — H&P (Signed)
Ronnell Richards  Location: [facility] Patient #: [id number] DOB: [birth year] Single / Language: Alexandria Richards / Race: Black or African American Female      History and physical  The patient is a 40 year old female who presents with breast cancer. 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 She was originally evaluated in the M.D. see by Dr. [name], Dr.Moody , and me. Dr. Harlan Stains is her PCP.  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 On February 16, 2015 she underwent right partial mastectomy and right axillary sentinel node biopsy. The breast are small. The tumor was palpable high in the upper outer quadrant. I had to take a generous ellipse of skin to avoid a positive anterior margin. I was able to do everything through a single incision. Final pathology shows invasive carcinoma and negative margins for the invasive component but very close anterior and posterior margins for the in situ component. One out of 6 lymph nodes were positive.  I have advised that there is really nothing further to do surgically short of a mastectomy. The anterior margin is the surrounding skin and the posterior margin is the pectoralis muscle.  She is doing fairly well. I gave her a refill on pain medicine yesterday. She is moving her shoulder around fairly well but still is sore.  She saw Dr. [name] yesterday but is fearful of chemotherapy. Fearful of the cancer. Has not made any decisions about adjuvant therapies. Says she doesn't have follow-up appointments.  I had a long talk with her and her boyfriend. I strongly encouraged her to have chemotherapy as the next step and explained that in detail. Also encouraged her to have whole breast radiation therapy with possible  boost to the right axilla. She is thinking about this but is not willing to say that she is going to do this. I spent a long time trying to encourage her and explained the rationale for these therapies. I discussed the indications, details, techniques and numerous risk for Port-A-Cath insertion.  I will refer her to physical therapy for her right shoulder. Return she will return to see me in 6 weeks    Allergies  No Known Drug Allergies01/13/2017  Medication History  Hydrocodone-Acetaminophen (5-325MG Tablet, 1 (one) Tablet Oral every four-six hours, Taken starting 02/22/2015) Active. Tylenol (325MG Tablet, Oral as needed) Active. Flexeril (10MG Tablet, Oral) Active. Lexapro (10MG Tablet, Oral) Active. Prazosin HCl (1MG Capsule, Oral) Active. Medications Reconciled  Vitals   Weight: 113 lb Height: 62in Body Surface Area: 1.5 m Body Mass Index: 20.67 kg/m  BP: 104/68 (Sitting, Left Arm, Standard)       Physical Exam  General Note: Alert. Cooperative. Mental status normal. Some anxiety but no physical distress.  Neck: No adenopathy or mass.  Chest: Lungs are clear.  No clavicular deformity  Heart: Regular rate and rhythm.  No murmur.  No ectopy  Breast Note: A curvilinear incision high in the upper outer quadrant of the right breast or the axilla. Skin edges are healthy. No infection. No hematoma. No axillary mass. She can abduct the right shoulder about 150. No arm swelling     Assessment & Plan  BREAST CANCER OF UPPER-OUTER QUADRANT OF RIGHT FEMALE BREAST (C50.411)   Your right breast incision is healing without any signs of any complication You are moving your right shoulder  around fairly well. you will be referred to physical therapy to help with shoulder exercises No sports or heavy lifting for 3 weeks from the date of surgery, no restrictions after that I strongly encouraged you to consider the chemotherapy that was recommended by Dr.  [name] I strongly encouraged you to have radiation therapy with Dr. Lisbeth Renshaw Please make appointments with those other doctors as soon as possible  We have discussed the Port-A-Cath insertion if you choose to have the chemotherapy. Let know if you decide and we will schedule the Port-A-Cath insertion  Return to see Dr. Dalbert Batman in 6 weeks for a wound healing check.  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

## 2015-03-11 ENCOUNTER — Ambulatory Visit (HOSPITAL_BASED_OUTPATIENT_CLINIC_OR_DEPARTMENT_OTHER)
Admission: RE | Admit: 2015-03-11 | Discharge: 2015-03-11 | Disposition: A | Discharge status: Home / Self Care | Payer: Medicaid Other | Source: Ambulatory Visit | Attending: General Surgery | Admitting: General Surgery

## 2015-03-11 ENCOUNTER — Encounter (HOSPITAL_BASED_OUTPATIENT_CLINIC_OR_DEPARTMENT_OTHER)
Admission: RE | Disposition: A | Discharge status: Home / Self Care | Payer: Self-pay | Source: Ambulatory Visit | Attending: General Surgery

## 2015-03-11 ENCOUNTER — Encounter (HOSPITAL_BASED_OUTPATIENT_CLINIC_OR_DEPARTMENT_OTHER): Payer: Self-pay | Admitting: *Deleted

## 2015-03-11 ENCOUNTER — Ambulatory Visit (HOSPITAL_BASED_OUTPATIENT_CLINIC_OR_DEPARTMENT_OTHER): Payer: Medicaid Other | Admitting: Anesthesiology

## 2015-03-11 ENCOUNTER — Ambulatory Visit (HOSPITAL_COMMUNITY): Payer: Medicaid Other

## 2015-03-11 DIAGNOSIS — Z419 Encounter for procedure for purposes other than remedying health state, unspecified: Secondary | ICD-10-CM

## 2015-03-11 DIAGNOSIS — Z01818 Encounter for other preprocedural examination: Secondary | ICD-10-CM

## 2015-03-11 DIAGNOSIS — F1290 Cannabis use, unspecified, uncomplicated: Secondary | ICD-10-CM | POA: Insufficient documentation

## 2015-03-11 DIAGNOSIS — Z87891 Personal history of nicotine dependence: Secondary | ICD-10-CM | POA: Insufficient documentation

## 2015-03-11 DIAGNOSIS — Z95828 Presence of other vascular implants and grafts: Secondary | ICD-10-CM

## 2015-03-11 DIAGNOSIS — C50411 Malignant neoplasm of upper-outer quadrant of right female breast: Secondary | ICD-10-CM

## 2015-03-11 DIAGNOSIS — C50911 Malignant neoplasm of unspecified site of right female breast: Secondary | ICD-10-CM | POA: Diagnosis present

## 2015-03-11 HISTORY — DX: Nasal congestion: R09.81

## 2015-03-11 HISTORY — DX: Urgency of urination: R39.15

## 2015-03-11 HISTORY — PX: PORTACATH PLACEMENT: SHX2246

## 2015-03-11 HISTORY — DX: Postnasal drip: R09.82

## 2015-03-11 SURGERY — INSERTION, TUNNELED CENTRAL VENOUS DEVICE, WITH PORT
Anesthesia: General | Site: Neck | Laterality: Left

## 2015-03-11 MED ORDER — BUPIVACAINE-EPINEPHRINE (PF) 0.5% -1:200000 IJ SOLN
INTRAMUSCULAR | Status: DC | PRN
  Administered 2015-03-11: 7 mL via PERINEURAL

## 2015-03-11 MED ORDER — FENTANYL CITRATE (PF) 100 MCG/2ML IJ SOLN
50.0000 ug | INTRAMUSCULAR | Status: AC | PRN
  Administered 2015-03-11: 50 ug via INTRAVENOUS
  Administered 2015-03-11: 100 ug via INTRAVENOUS
  Administered 2015-03-11: 50 ug via INTRAVENOUS

## 2015-03-11 MED ORDER — HEPARIN SOD (PORK) LOCK FLUSH 100 UNIT/ML IV SOLN
INTRAVENOUS | Status: DC | PRN
  Administered 2015-03-11: 500 [IU] via INTRAVENOUS

## 2015-03-11 MED ORDER — ACETAMINOPHEN 650 MG RE SUPP: 650.0000 mg | RECTAL | Status: DC | PRN

## 2015-03-11 MED ORDER — LIDOCAINE HCL (CARDIAC) 20 MG/ML IV SOLN
INTRAVENOUS | Status: DC | PRN
  Administered 2015-03-11: 60 mg via INTRAVENOUS

## 2015-03-11 MED ORDER — OXYCODONE HCL 5 MG/5ML PO SOLN
5.0000 mg | Freq: Once | ORAL | Status: DC | PRN
Start: 2015-03-11 — End: 2015-03-11

## 2015-03-11 MED ORDER — MEPERIDINE HCL 25 MG/ML IJ SOLN: 6.2500 mg | INTRAMUSCULAR | Status: DC | PRN

## 2015-03-11 MED ORDER — METOCLOPRAMIDE HCL 5 MG/ML IJ SOLN: 10.0000 mg | Freq: Once | INTRAMUSCULAR | Status: DC | PRN

## 2015-03-11 MED ORDER — MIDAZOLAM HCL 2 MG/2ML IJ SOLN
INTRAMUSCULAR | Status: AC
  Filled 2015-03-11: qty 2

## 2015-03-11 MED ORDER — FENTANYL CITRATE (PF) 100 MCG/2ML IJ SOLN
INTRAMUSCULAR | Status: AC
  Filled 2015-03-11: qty 2

## 2015-03-11 MED ORDER — LIDOCAINE HCL (CARDIAC) 20 MG/ML IV SOLN
INTRAVENOUS | Status: AC
  Filled 2015-03-11: qty 5

## 2015-03-11 MED ORDER — SODIUM CHLORIDE 0.9 % IV SOLN: 250.0000 mL | INTRAVENOUS | Status: DC | PRN

## 2015-03-11 MED ORDER — OXYCODONE HCL 5 MG PO TABS: 5.0000 mg | ORAL_TABLET | Freq: Once | ORAL | Status: DC | PRN

## 2015-03-11 MED ORDER — HEPARIN (PORCINE) IN NACL 2-0.9 UNIT/ML-% IJ SOLN
INTRAMUSCULAR | Status: AC
  Filled 2015-03-11: qty 500

## 2015-03-11 MED ORDER — CEFAZOLIN SODIUM-DEXTROSE 2-3 GM-% IV SOLR
2.0000 g | INTRAVENOUS | Status: AC
  Administered 2015-03-11: 2 g via INTRAVENOUS

## 2015-03-11 MED ORDER — LIDOCAINE-EPINEPHRINE (PF) 1 %-1:200000 IJ SOLN
INTRAMUSCULAR | Status: AC
  Filled 2015-03-11: qty 30

## 2015-03-11 MED ORDER — DEXAMETHASONE SODIUM PHOSPHATE 10 MG/ML IJ SOLN
INTRAMUSCULAR | Status: AC
  Filled 2015-03-11: qty 1

## 2015-03-11 MED ORDER — LACTATED RINGERS IV SOLN
INTRAVENOUS | Status: DC
  Administered 2015-03-11: 07:00:00 via INTRAVENOUS

## 2015-03-11 MED ORDER — FENTANYL CITRATE (PF) 100 MCG/2ML IJ SOLN: 25.0000 ug | INTRAMUSCULAR | Status: DC | PRN

## 2015-03-11 MED ORDER — ONDANSETRON HCL 4 MG/2ML IJ SOLN
INTRAMUSCULAR | Status: AC
  Filled 2015-03-11: qty 2

## 2015-03-11 MED ORDER — SCOPOLAMINE 1 MG/3DAYS TD PT72: 1.0000 | MEDICATED_PATCH | Freq: Once | TRANSDERMAL | Status: DC | PRN

## 2015-03-11 MED ORDER — SODIUM CHLORIDE 0.9% FLUSH: 3.0000 mL | Freq: Two times a day (BID) | INTRAVENOUS | Status: DC

## 2015-03-11 MED ORDER — SODIUM CHLORIDE 0.9 % IV SOLN: INTRAVENOUS | Status: DC

## 2015-03-11 MED ORDER — CEFAZOLIN SODIUM-DEXTROSE 2-3 GM-% IV SOLR
INTRAVENOUS | Status: AC
  Filled 2015-03-11: qty 50

## 2015-03-11 MED ORDER — CHLORHEXIDINE GLUCONATE 4 % EX LIQD: 1.0000 "application " | Freq: Once | CUTANEOUS | Status: DC

## 2015-03-11 MED ORDER — FENTANYL CITRATE (PF) 100 MCG/2ML IJ SOLN
25.0000 ug | INTRAMUSCULAR | Status: DC | PRN
  Administered 2015-03-11 (×2): 25 ug via INTRAVENOUS

## 2015-03-11 MED ORDER — HYDROCODONE-ACETAMINOPHEN 5-325 MG PO TABS: 1.0000 | ORAL_TABLET | Freq: Four times a day (QID) | ORAL | Status: DC | PRN

## 2015-03-11 MED ORDER — OXYCODONE HCL 5 MG PO TABS: 5.0000 mg | ORAL_TABLET | ORAL | Status: DC | PRN

## 2015-03-11 MED ORDER — HEPARIN (PORCINE) IN NACL 2-0.9 UNIT/ML-% IJ SOLN
INTRAMUSCULAR | Status: DC | PRN
  Administered 2015-03-11: 500 mL via INTRAVENOUS

## 2015-03-11 MED ORDER — HEPARIN SOD (PORK) LOCK FLUSH 100 UNIT/ML IV SOLN
INTRAVENOUS | Status: AC
  Filled 2015-03-11: qty 5

## 2015-03-11 MED ORDER — MIDAZOLAM HCL 2 MG/2ML IJ SOLN
1.0000 mg | INTRAMUSCULAR | Status: DC | PRN
  Administered 2015-03-11: 2 mg via INTRAVENOUS

## 2015-03-11 MED ORDER — CHLORHEXIDINE GLUCONATE 4 % EX LIQD
1.0000 | Freq: Once | CUTANEOUS | Status: DC
Start: 2015-03-12 — End: 2015-03-11

## 2015-03-11 MED ORDER — ACETAMINOPHEN 325 MG PO TABS: 650.0000 mg | ORAL_TABLET | ORAL | Status: DC | PRN

## 2015-03-11 MED ORDER — SODIUM BICARBONATE 4 % IV SOLN
INTRAVENOUS | Status: AC
  Filled 2015-03-11: qty 5

## 2015-03-11 MED ORDER — BUPIVACAINE-EPINEPHRINE (PF) 0.5% -1:200000 IJ SOLN
INTRAMUSCULAR | Status: AC
  Filled 2015-03-11: qty 30

## 2015-03-11 MED ORDER — ONDANSETRON HCL 4 MG/2ML IJ SOLN
INTRAMUSCULAR | Status: DC | PRN
  Administered 2015-03-11: 4 mg via INTRAVENOUS

## 2015-03-11 MED ORDER — PROPOFOL 500 MG/50ML IV EMUL
INTRAVENOUS | Status: AC
  Filled 2015-03-11: qty 50

## 2015-03-11 MED ORDER — HYDROCODONE-ACETAMINOPHEN 7.5-325 MG PO TABS: 1.0000 | ORAL_TABLET | Freq: Once | ORAL | Status: DC | PRN

## 2015-03-11 MED ORDER — FENTANYL CITRATE (PF) 100 MCG/2ML IJ SOLN
INTRAMUSCULAR | Status: AC
Start: 2015-03-11 — End: 2015-03-11
  Filled 2015-03-11: qty 2

## 2015-03-11 MED ORDER — DEXAMETHASONE SODIUM PHOSPHATE 4 MG/ML IJ SOLN
INTRAMUSCULAR | Status: DC | PRN
  Administered 2015-03-11: 10 mg via INTRAVENOUS

## 2015-03-11 MED ORDER — GLYCOPYRROLATE 0.2 MG/ML IJ SOLN: 0.2000 mg | Freq: Once | INTRAMUSCULAR | Status: DC | PRN

## 2015-03-11 MED ORDER — PROPOFOL 10 MG/ML IV BOLUS
INTRAVENOUS | Status: DC | PRN
  Administered 2015-03-11: 150 mg via INTRAVENOUS

## 2015-03-11 MED ORDER — SODIUM CHLORIDE 0.9% FLUSH: 3.0000 mL | INTRAVENOUS | Status: DC | PRN

## 2015-03-11 SURGICAL SUPPLY — 55 items
BAG DECANTER FOR FLEXI CONT (MISCELLANEOUS) ×3 IMPLANT
BENZOIN TINCTURE PRP APPL 2/3 (GAUZE/BANDAGES/DRESSINGS) IMPLANT
BLADE HEX COATED 2.75 (ELECTRODE) ×3 IMPLANT
BLADE SURG 15 STRL LF DISP TIS (BLADE) ×1 IMPLANT
BLADE SURG 15 STRL SS (BLADE) ×2
CANISTER SUCT 1200ML W/VALVE (MISCELLANEOUS) IMPLANT
CHLORAPREP W/TINT 26ML (MISCELLANEOUS) ×3 IMPLANT
CLOSURE WOUND 1/2 X4 (GAUZE/BANDAGES/DRESSINGS)
COVER BACK TABLE 60X90IN (DRAPES) ×3 IMPLANT
COVER MAYO STAND STRL (DRAPES) ×3 IMPLANT
COVER PROBE 5X48 (MISCELLANEOUS) ×2
DECANTER SPIKE VIAL GLASS SM (MISCELLANEOUS) IMPLANT
DERMABOND ADVANCED (GAUZE/BANDAGES/DRESSINGS) ×2
DERMABOND ADVANCED .7 DNX12 (GAUZE/BANDAGES/DRESSINGS) ×1 IMPLANT
DRAPE C-ARM 42X72 X-RAY (DRAPES) ×3 IMPLANT
DRAPE LAPAROSCOPIC ABDOMINAL (DRAPES) ×3 IMPLANT
DRAPE UTILITY XL STRL (DRAPES) ×3 IMPLANT
DRSG TEGADERM 2-3/8X2-3/4 SM (GAUZE/BANDAGES/DRESSINGS) IMPLANT
DRSG TEGADERM 4X10 (GAUZE/BANDAGES/DRESSINGS) IMPLANT
DRSG TEGADERM 4X4.75 (GAUZE/BANDAGES/DRESSINGS) IMPLANT
ELECT REM PT RETURN 9FT ADLT (ELECTROSURGICAL) ×3
ELECTRODE REM PT RTRN 9FT ADLT (ELECTROSURGICAL) ×1 IMPLANT
GLOVE BIO SURGEON STRL SZ 6.5 (GLOVE) ×4 IMPLANT
GLOVE BIO SURGEONS STRL SZ 6.5 (GLOVE) ×2
GLOVE BIOGEL PI IND STRL 7.0 (GLOVE) ×2 IMPLANT
GLOVE BIOGEL PI INDICATOR 7.0 (GLOVE) ×4
GLOVE EUDERMIC 7 POWDERFREE (GLOVE) ×3 IMPLANT
GOWN STRL REUS W/ TWL LRG LVL3 (GOWN DISPOSABLE) ×1 IMPLANT
GOWN STRL REUS W/ TWL XL LVL3 (GOWN DISPOSABLE) ×1 IMPLANT
GOWN STRL REUS W/TWL LRG LVL3 (GOWN DISPOSABLE) ×2
GOWN STRL REUS W/TWL XL LVL3 (GOWN DISPOSABLE) ×2
IV CATH PLACEMENT UNIT 16 GA (IV SOLUTION) IMPLANT
IV KIT MINILOC 20X1 SAFETY (NEEDLE) IMPLANT
KIT CVR 48X5XPRB PLUP LF (MISCELLANEOUS) ×1 IMPLANT
KIT PORT POWER 8FR ISP CVUE (Catheter) ×3 IMPLANT
NEEDLE BLUNT 17GA (NEEDLE) IMPLANT
NEEDLE HYPO 22GX1.5 SAFETY (NEEDLE) ×3 IMPLANT
NEEDLE HYPO 25X1 1.5 SAFETY (NEEDLE) ×3 IMPLANT
PACK BASIN DAY SURGERY FS (CUSTOM PROCEDURE TRAY) ×3 IMPLANT
PENCIL BUTTON HOLSTER BLD 10FT (ELECTRODE) ×3 IMPLANT
SET SHEATH INTRODUCER 10FR (MISCELLANEOUS) IMPLANT
SHEATH COOK PEEL AWAY SET 9F (SHEATH) IMPLANT
SLEEVE SCD COMPRESS KNEE MED (MISCELLANEOUS) ×3 IMPLANT
SPONGE GAUZE 4X4 12PLY STER LF (GAUZE/BANDAGES/DRESSINGS) IMPLANT
STRIP CLOSURE SKIN 1/2X4 (GAUZE/BANDAGES/DRESSINGS) IMPLANT
SUT MNCRL AB 4-0 PS2 18 (SUTURE) ×3 IMPLANT
SUT PROLENE 2 0 CT2 30 (SUTURE) ×3 IMPLANT
SUT VICRYL 3-0 CR8 SH (SUTURE) ×3 IMPLANT
SYR 5ML LUER SLIP (SYRINGE) ×3 IMPLANT
SYRINGE 10CC LL (SYRINGE) ×3 IMPLANT
TOWEL OR 17X24 6PK STRL BLUE (TOWEL DISPOSABLE) ×6 IMPLANT
TOWEL OR NON WOVEN STRL DISP B (DISPOSABLE) ×3 IMPLANT
TUBE CONNECTING 20'X1/4 (TUBING)
TUBE CONNECTING 20X1/4 (TUBING) IMPLANT
YANKAUER SUCT BULB TIP NO VENT (SUCTIONS) IMPLANT

## 2015-03-11 NOTE — Anesthesia Procedure Notes (Signed)
Procedure Name: LMA Insertion Date/Time: 03/11/2015 7:33 AM Performed by: Maryella Shivers Pre-anesthesia Checklist: Patient identified, Emergency Drugs available, Suction available and Patient being monitored Patient Re-evaluated:Patient Re-evaluated prior to inductionOxygen Delivery Method: Circle System Utilized Preoxygenation: Pre-oxygenation with 100% oxygen Intubation Type: IV induction Ventilation: Mask ventilation without difficulty LMA: LMA inserted LMA Size: 4.0 Number of attempts: 1 Airway Equipment and Method: Bite block Placement Confirmation: positive ETCO2 Tube secured with: Tape Dental Injury: Teeth and Oropharynx as per pre-operative assessment

## 2015-03-11 NOTE — Discharge Instructions (Signed)

## 2015-03-11 NOTE — Interval H&P Note (Signed)
History and Physical Interval Note:  03/11/2015 6:52 AM  Alexandria Richards  has presented today for surgery, with the diagnosis of BREAST CANCER  The various methods of treatment have been discussed with the patient and family. After consideration of risks, benefits and other options for treatment, the patient has consented to  Procedure(s): INSERTION PORT-A-CATH WITH ULTRASOUND (N/A) as a surgical intervention .  The patient's history has been reviewed, patient examined, no change in status, stable for surgery.  I have reviewed the patient's chart and labs.  Questions were answered to the patient's satisfaction.     [name]

## 2015-03-11 NOTE — Op Note (Signed)
Patient Name:           Alexandria Richards   Date of Surgery:        03/11/2015  Pre op Diagnosis:    Cancer right breast     Post op Diagnosis:    Same  Procedure:                 Insertion of 8 [place] ClearVue PowerPort venous vascular access device, tunneled, use of fluoroscopy for guidance and positioning  Surgeon:                     Edsel Petrin. Dalbert Batman, M.D., FACS  Assistant:                      OR staff  Operative Indications;    This is a 39 year old African American female was recently diagnosed with invasive cancer of the right breast, upper outer quadrant. She was originally evaluated in the M.D. see by Dr. [name], Dr.Moody , and me. Dr. Harlan Stains is her PCP.      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 On February 16, 2015 she underwent right partial mastectomy and right axillary sentinel node biopsy. The breast are small. The tumor was palpable high in the upper outer quadrant. I had to take a generous ellipse of skin to avoid a positive anterior margin. I was able to do everything through a single incision. Final pathology shows invasive carcinoma and negative margins for the invasive component but very close anterior and posterior margins for the in situ component. One out of 6 lymph nodes were positive.     She has been evaluated by Dr. [name] who advised adjuvant chemotherapy.  She has agreed with this.  She presents today for Port-A-Cath insertion. I discussed the indications, details, techniques and numerous risk for Port-A-Cath insertion.   Operative Findings:       The port was placed through the left subclavian vein.  A single venipuncture was required.  The catheter position looked good under fluoroscopy with the tip of the catheter in the superior vena cava above the right atrium.  The catheter flushed well and had excellent blood  return  Procedure in Detail:          Following the induction of general LMA anesthesia the patient was positioned with her arms tucked at her sides and a small roll behind her shoulders.  The neck and chest were prepped and draped in a sterile fashion.  Surgical timeout was performed.  Intravenous antibiotics were given.  0.5% Marcaine with epinephrine was used as a local infiltration anesthetic.  A left subclavian venipuncture was performed and a guidewire inserted into the superior vena cava under fluoroscopic guidance.  A small incision was made at the wire insertion site.  Using fluoroscopy I marked a template on the chest wall for positioning of the catheter and measurement of its length.  The catheter was measured and cut at the 23 cm mark.  The catheter was secured to the port with the locking device and sutured to the pectoralis fascia with 2-0 Prolene sutures.  The port and catheter were flushed with heparinized saline.  The dilator and peel-away sheath assembly were inserted over the guidewire into the central venous circulation, the dilator and wire were removed, the catheter easily threaded into the central venous circulation and the peel-away sheath  removed.  The catheter flushed well and had excellent blood return.  Fluoroscopy confirmed good positioning of the catheter without any deformity of the catheter along its course.  The catheter was flushed with concentrated heparin.  Incisions were closed with 3-0 Vicryl subcutaneous sutures and 4-0 Monocryl subcuticular sutures on the skin and Dermabond.  The patient tolerated the procedure well was taken to PACU in stable condition.  A chest x-ray will be obtained in the PACU.  EBL 10 mL or less.  Complications none.     Edsel Petrin. Dalbert Batman, M.D., FACS General and Minimally Invasive Surgery Breast and Colorectal Surgery  03/11/2015 8:22 AM

## 2015-03-11 NOTE — Anesthesia Preprocedure Evaluation (Addendum)
Anesthesia Evaluation  Patient identified by MRN, date of birth, ID band Patient awake    Reviewed: Allergy & Precautions, NPO status , Patient's Chart, lab work & pertinent test results  History of Anesthesia Complications Negative for: history of anesthetic complications  Airway Mallampati: I  TM Distance: >3 FB Neck ROM: Full    Dental no notable dental hx. (+) Teeth Intact   Pulmonary neg recent URI, former smoker,  Sinus drainage   Pulmonary exam normal breath sounds clear to auscultation       Cardiovascular negative cardio ROS Normal cardiovascular exam Rhythm:Regular Rate:Normal     Neuro/Psych Anxiety negative neurological ROS     GI/Hepatic negative GI ROS, (+)     substance abuse  marijuana use,   Endo/Other  negative endocrine ROSBreast Ca  Renal/GU negative Renal ROS  negative genitourinary   Musculoskeletal negative musculoskeletal ROS (+)   Abdominal   Peds  Hematology negative hematology ROS (+)   Anesthesia Other Findings   Reproductive/Obstetrics negative OB ROS                           Anesthesia Physical Anesthesia Plan  ASA: II  Anesthesia Plan: General   Post-op Pain Management:    Induction: Intravenous  Airway Management Planned: LMA  Additional Equipment:   Intra-op Plan:   Post-operative Plan: Extubation in OR  Informed Consent: I have reviewed the patients History and Physical, chart, labs and discussed the procedure including the risks, benefits and alternatives for the proposed anesthesia with the patient or authorized representative who has indicated his/her understanding and acceptance.   Dental advisory given  Plan Discussed with: CRNA, Anesthesiologist and Surgeon  Anesthesia Plan Comments:        Anesthesia Quick Evaluation

## 2015-03-11 NOTE — Transfer of Care (Signed)
Immediate Anesthesia Transfer of Care Note  Patient: Alexandria Richards  Procedure(s) Performed: Procedure(s): INSERTION PORT-A-CATH WITH ULTRASOUND (Left)  Patient Location: PACU  Anesthesia Type:General  Level of Consciousness: sedated  Airway & Oxygen Therapy: Patient Spontanous Breathing and Patient connected to face mask oxygen  Post-op Assessment: Report given to RN and Post -op Vital signs reviewed and stable  Post vital signs: Reviewed and stable  Last Vitals:  Filed Vitals:   03/11/15 0822 03/11/15 0823  BP:    Pulse: 85 85  Temp:    Resp:  11    Complications: No apparent anesthesia complications

## 2015-03-11 NOTE — Anesthesia Postprocedure Evaluation (Signed)
Anesthesia Post Note  Patient: Alexandria Richards  Procedure(s) Performed: Procedure(s) (LRB): INSERTION PORT-A-CATH WITH ULTRASOUND (Left)  Patient location during evaluation: PACU Anesthesia Type: General Level of consciousness: awake and alert and oriented Pain management: pain level controlled Vital Signs Assessment: post-procedure vital signs reviewed and stable Respiratory status: spontaneous breathing, nonlabored ventilation and respiratory function stable Cardiovascular status: blood pressure returned to baseline and stable Postop Assessment: no signs of nausea or vomiting Anesthetic complications: no    Last Vitals:  Filed Vitals:   03/11/15 0915 03/11/15 0945  BP: 118/86 122/68  Pulse: 72 78  Temp:  36.5 C  Resp: 16 14    Last Pain:  Filed Vitals:   03/11/15 0947  PainSc: 1                  Sundai Probert A.

## 2015-03-14 ENCOUNTER — Encounter (HOSPITAL_BASED_OUTPATIENT_CLINIC_OR_DEPARTMENT_OTHER): Payer: Self-pay | Admitting: General Surgery

## 2015-03-18 ENCOUNTER — Encounter (HOSPITAL_COMMUNITY): Payer: Self-pay

## 2015-03-18 ENCOUNTER — Other Ambulatory Visit: Payer: Self-pay

## 2015-03-18 ENCOUNTER — Ambulatory Visit (HOSPITAL_BASED_OUTPATIENT_CLINIC_OR_DEPARTMENT_OTHER): Payer: Medicaid Other

## 2015-03-18 ENCOUNTER — Ambulatory Visit (HOSPITAL_COMMUNITY)
Admission: RE | Admit: 2015-03-18 | Discharge: 2015-03-18 | Disposition: A | Discharge status: Home / Self Care | Payer: Medicaid Other | Source: Ambulatory Visit | Attending: Cardiology | Admitting: Cardiology

## 2015-03-18 VITALS — BP 116/70 | HR 77 | Wt 114.5 lb

## 2015-03-18 DIAGNOSIS — I071 Rheumatic tricuspid insufficiency: Secondary | ICD-10-CM | POA: Insufficient documentation

## 2015-03-18 DIAGNOSIS — C50411 Malignant neoplasm of upper-outer quadrant of right female breast: Secondary | ICD-10-CM | POA: Insufficient documentation

## 2015-03-18 NOTE — Patient Instructions (Signed)
Follow up and Echo in 6 weeks with Dr.McLean

## 2015-03-20 NOTE — Progress Notes (Signed)
Patient ID: Alexandria Richards, female   DOB: March 13, 1975, 40 y.o.   MRN: [id number] Oncologist: Dr. [name]  40 yo with newly diagnosed breast cancer presents for cardio-oncology clinic evaluation prior to starting Adriamycin-containing chemotherapy regimen. Right breast cancer was diagnosed 2/17, lumpectomy 02/16/15.  ER+/PR+/HER2-.  Plan for adjuvant chemotherapy with Adriamycin/Cytoxan then Abraxane weekly x 12 cycles.  This will be followed by radiation.   No history of heart disease.  No family history of heart disease.  No exertional dyspnea or chest pain.  No palpitations, syncope, or lightheadedness.   PMH: 1. Breast cancer: Diagnosed 2/17, lumpectomy 02/16/15.  ER+/PR+/HER2-.  Plan for adjuvant chemotherapy with Adriamycin/Cytoxan then Abraxane weekly x 12 cycles.  This will be followed by radiation.  - Echo (3/17) with EF 61-44%, normal diastolic function, normal RV size and systolic function, lateral s' 14.9, GLS -22.2%.   Social History   Social History  . Marital Status: Single    Spouse Name: N/A  . Number of Children: 3  . Years of Education: N/A   Occupational History  . Not on file.   Social History Main Topics  . Smoking status: Former Research scientist (life sciences)  . Smokeless tobacco: Former Systems developer    Quit date: 01/15/2015  . Alcohol Use: Yes     Comment: Occassionally  . Drug Use: Yes    Special: Marijuana     Comment: last smoked marijuana 02/05/2015  . Sexual Activity: Yes    Birth Control/ Protection: None, Condom     Comment: states last Depo 12/2014   Other Topics Concern  . Not on file   Social History Narrative   Family History  Problem Relation Age of Onset  . Breast cancer Maternal Aunt     dx over 55  . Autism Brother     paternal 1/2 brother   ROS: All systems reviewed and negative except as per HPI.   Current Outpatient Prescriptions  Medication Sig Dispense Refill  . ALPRAZolam (XANAX) 0.25 MG tablet Take 0.25 mg by mouth at bedtime as needed for anxiety.    Marland Kitchen  HYDROcodone-acetaminophen (NORCO) 5-325 MG tablet Take 1-2 tablets by mouth every 6 (six) hours as needed for moderate pain or severe pain. 30 tablet 0  . ibuprofen (ADVIL,MOTRIN) 200 MG tablet Take 200 mg by mouth every 6 (six) hours as needed.    . nicotine (NICODERM CQ - DOSED IN MG/24 HOURS) 21 mg/24hr patch Place 21 mg onto the skin daily.    . prazosin (MINIPRESS) 1 MG capsule TAKE 1 CAPSULE BY MOUTH AT BEDTIME FOR NIGHTMARES  1  . dexamethasone (DECADRON) 4 MG tablet Take 1 tablet (4 mg total) by mouth daily. Take 1 tablets once a day on the day after chemo and then take 1 tablet once daily for 2 days (Patient not taking: Reported on 03/18/2015) 30 tablet 1  . lidocaine-prilocaine (EMLA) cream Apply to affected area once (Patient not taking: Reported on 03/18/2015) 30 g 3  . LORazepam (ATIVAN) 0.5 MG tablet Take 1 tablet (0.5 mg total) by mouth at bedtime. (Patient not taking: Reported on 03/18/2015) 30 tablet 0  . ondansetron (ZOFRAN) 8 MG tablet Take 1 tablet (8 mg total) by mouth 2 (two) times daily as needed. Start on the third day after chemotherapy. (Patient not taking: Reported on 03/18/2015) 30 tablet 1  . prochlorperazine (COMPAZINE) 10 MG tablet Take 1 tablet (10 mg total) by mouth every 6 (six) hours as needed (Nausea or vomiting). (Patient not taking: Reported on  03/18/2015) 30 tablet 1   No current facility-administered medications for this encounter.   BP 116/70 mmHg  Pulse 77  Wt 114 lb 8 oz (51.937 kg)  SpO2 99% General: NAD Neck: No JVD, no thyromegaly or thyroid nodule.  Lungs: Clear to auscultation bilaterally with normal respiratory effort. CV: Nondisplaced PMI.  Heart regular S1/S2, no S3/S4, no murmur.  No peripheral edema.  No carotid bruit.  Normal pedal pulses.  Abdomen: Soft, nontender, no hepatosplenomegaly, no distention.  Skin: Intact without lesions or rashes.  Neurologic: Alert and oriented x 3.  Psych: Normal affect. Extremities: No clubbing or cyanosis.  HEENT:  Normal.   Assessment/Plan: Patient has newly-diagnosed breast cancer and will be starting on Adriamycin/Cytoxan adjuvant chemotherapy.  She has no prior cardiac history.  Echo was done today and reviewed, Normal EF and strain pattern.  - Repeat echo in 6 weeks and then in 6 months to monitor for Adriamycin cardiotoxicity.   Loralie Champagne 03/20/2015

## 2015-03-22 ENCOUNTER — Other Ambulatory Visit: Payer: Self-pay | Admitting: *Deleted

## 2015-03-22 ENCOUNTER — Encounter: Payer: Self-pay | Admitting: *Deleted

## 2015-03-22 ENCOUNTER — Other Ambulatory Visit: Payer: Self-pay | Admitting: Hematology and Oncology

## 2015-03-22 ENCOUNTER — Encounter: Payer: Self-pay | Admitting: Hematology and Oncology

## 2015-03-22 ENCOUNTER — Other Ambulatory Visit: Payer: Medicaid Other

## 2015-03-22 DIAGNOSIS — C50411 Malignant neoplasm of upper-outer quadrant of right female breast: Secondary | ICD-10-CM

## 2015-03-22 MED ORDER — ONDANSETRON HCL 8 MG PO TABS
8.0000 mg | ORAL_TABLET | Freq: Two times a day (BID) | ORAL | Status: AC | PRN
Start: 2015-03-22 — End: ?

## 2015-03-22 MED ORDER — DEXAMETHASONE 4 MG PO TABS: 4.0000 mg | ORAL_TABLET | Freq: Every day | ORAL | Status: AC

## 2015-03-22 MED ORDER — PROCHLORPERAZINE MALEATE 10 MG PO TABS: 10.0000 mg | ORAL_TABLET | Freq: Four times a day (QID) | ORAL | Status: DC | PRN

## 2015-03-22 MED ORDER — LIDOCAINE-PRILOCAINE 2.5-2.5 % EX CREA: TOPICAL_CREAM | CUTANEOUS | Status: AC

## 2015-03-22 NOTE — Progress Notes (Signed)
Met w/ pt regarding copay assistance.  Informed pt that her ins will pay for her treatment at 100%.  Informed pt of the [place] and went over what it will and will not cover and gave her an expense sheet.  Pt will supply a letter of support to apply for the grant, also gave pt applications for [employer], [employer] and [facility] to apply for additional assistance for personal bills.  She will return those applications along with the required documents so I can fax them in her behalf.  She has my card for any additional questions or concerns.

## 2015-03-25 ENCOUNTER — Encounter: Payer: Self-pay | Admitting: Hematology and Oncology

## 2015-03-25 ENCOUNTER — Ambulatory Visit (HOSPITAL_BASED_OUTPATIENT_CLINIC_OR_DEPARTMENT_OTHER): Payer: Medicaid Other

## 2015-03-25 ENCOUNTER — Ambulatory Visit (HOSPITAL_BASED_OUTPATIENT_CLINIC_OR_DEPARTMENT_OTHER): Payer: Medicaid Other | Admitting: Nurse Practitioner

## 2015-03-25 ENCOUNTER — Encounter: Payer: Self-pay | Admitting: Nurse Practitioner

## 2015-03-25 ENCOUNTER — Other Ambulatory Visit (HOSPITAL_BASED_OUTPATIENT_CLINIC_OR_DEPARTMENT_OTHER): Payer: Medicaid Other

## 2015-03-25 VITALS — BP 108/73 | HR 72 | Temp 97.9°F | Resp 18 | Ht 62.0 in | Wt 116.3 lb

## 2015-03-25 DIAGNOSIS — C773 Secondary and unspecified malignant neoplasm of axilla and upper limb lymph nodes: Secondary | ICD-10-CM

## 2015-03-25 DIAGNOSIS — C50411 Malignant neoplasm of upper-outer quadrant of right female breast: Secondary | ICD-10-CM

## 2015-03-25 DIAGNOSIS — Z5111 Encounter for antineoplastic chemotherapy: Secondary | ICD-10-CM

## 2015-03-25 LAB — CBC WITH DIFFERENTIAL/PLATELET
BASO%: 0.5 % (ref 0.0–2.0)
Basophils Absolute: 0 10*3/uL (ref 0.0–0.1)
EOS ABS: 0.1 10*3/uL (ref 0.0–0.5)
EOS%: 1.2 % (ref 0.0–7.0)
HCT: 39.8 % (ref 34.8–46.6)
HGB: 12.9 g/dL (ref 11.6–15.9)
LYMPH%: 34.1 % (ref 14.0–49.7)
MCH: 26.4 pg (ref 25.1–34.0)
MCHC: 32.3 g/dL (ref 31.5–36.0)
MCV: 81.6 fL (ref 79.5–101.0)
MONO#: 0.4 10*3/uL (ref 0.1–0.9)
MONO%: 7.3 % (ref 0.0–14.0)
NEUT#: 3 10*3/uL (ref 1.5–6.5)
NEUT%: 56.9 % (ref 38.4–76.8)
PLATELETS: 259 10*3/uL (ref 145–400)
RBC: 4.88 10*6/uL (ref 3.70–5.45)
RDW: 14.5 % (ref 11.2–14.5)
WBC: 5.3 10*3/uL (ref 3.9–10.3)
lymph#: 1.8 10*3/uL (ref 0.9–3.3)

## 2015-03-25 LAB — COMPREHENSIVE METABOLIC PANEL
ALT: 14 U/L (ref 0–55)
ANION GAP: 7 meq/L (ref 3–11)
AST: 17 U/L (ref 5–34)
Albumin: 3.6 g/dL (ref 3.5–5.0)
Alkaline Phosphatase: 71 U/L (ref 40–150)
BUN: 9.2 mg/dL (ref 7.0–26.0)
CHLORIDE: 108 meq/L (ref 98–109)
CO2: 25 meq/L (ref 22–29)
Calcium: 9 mg/dL (ref 8.4–10.4)
Creatinine: 0.8 mg/dL (ref 0.6–1.1)
Glucose: 105 mg/dl (ref 70–140)
Potassium: 3.9 mEq/L (ref 3.5–5.1)
Sodium: 140 mEq/L (ref 136–145)
Total Protein: 8 g/dL (ref 6.4–8.3)

## 2015-03-25 MED ORDER — PEGFILGRASTIM 6 MG/0.6ML ~~LOC~~ PSKT
6.0000 mg | PREFILLED_SYRINGE | Freq: Once | SUBCUTANEOUS | Status: DC
Start: 2015-03-25 — End: 2015-03-25
  Filled 2015-03-25: qty 0.6

## 2015-03-25 MED ORDER — SODIUM CHLORIDE 0.9 % IV SOLN
Freq: Once | INTRAVENOUS | Status: AC
  Administered 2015-03-25: 12:00:00 via INTRAVENOUS
  Filled 2015-03-25: qty 5

## 2015-03-25 MED ORDER — DOXORUBICIN HCL CHEMO IV INJECTION 2 MG/ML
60.0000 mg/m2 | Freq: Once | INTRAVENOUS | Status: AC
  Administered 2015-03-25: 90 mg via INTRAVENOUS
  Filled 2015-03-25: qty 45

## 2015-03-25 MED ORDER — PALONOSETRON HCL INJECTION 0.25 MG/5ML
INTRAVENOUS | Status: AC
  Filled 2015-03-25: qty 5

## 2015-03-25 MED ORDER — SODIUM CHLORIDE 0.9 % IV SOLN
600.0000 mg/m2 | Freq: Once | INTRAVENOUS | Status: AC
  Administered 2015-03-25: 900 mg via INTRAVENOUS
  Filled 2015-03-25: qty 45

## 2015-03-25 MED ORDER — SODIUM CHLORIDE 0.9 % IV SOLN
Freq: Once | INTRAVENOUS | Status: AC
  Administered 2015-03-25: 11:00:00 via INTRAVENOUS

## 2015-03-25 MED ORDER — SODIUM CHLORIDE 0.9% FLUSH
10.0000 mL | INTRAVENOUS | Status: DC | PRN
  Administered 2015-03-25: 10 mL
  Filled 2015-03-25: qty 10

## 2015-03-25 MED ORDER — PALONOSETRON HCL INJECTION 0.25 MG/5ML
0.2500 mg | Freq: Once | INTRAVENOUS | Status: AC
  Administered 2015-03-25: 0.25 mg via INTRAVENOUS

## 2015-03-25 MED ORDER — HEPARIN SOD (PORK) LOCK FLUSH 100 UNIT/ML IV SOLN
500.0000 [IU] | Freq: Once | INTRAVENOUS | Status: AC | PRN
  Administered 2015-03-25: 500 [IU]
  Filled 2015-03-25: qty 5

## 2015-03-25 NOTE — Progress Notes (Signed)
Pt is approved for the $1000 [employer] grant.  

## 2015-03-25 NOTE — Progress Notes (Signed)
Patient Care Team: Kerin Perna, NP as PCP - General (Internal Medicine) Fanny Skates, MD as Consulting Physician (General Surgery) Nicholas Lose, MD as Consulting Physician (Hematology and Oncology) Kyung Rudd, MD as Consulting Physician (Radiation Oncology) Sylvan Cheese, NP as Nurse Practitioner (Hematology and Oncology)  DIAGNOSIS: Breast cancer of upper-outer quadrant of right female breast [facility])   Staging form: Breast, AJCC 7th Edition     Clinical stage from 01/19/2015: Stage IA (T1c, N0, M0) - Unsigned       Staging comments: Staged at breast conference on 1.4.17  SUMMARY OF ONCOLOGIC HISTORY:   Breast cancer of upper-outer quadrant of right female breast ([place])   12/31/2014 Mammogram Right breast over the palpable mass at 10:30 position: 1.5 x 1.1 x 1.3 cm with multiple abnormal appearing axillary lymph nodes largest 1.6 cm   01/05/2015 Initial Diagnosis Right breast biopsy: IDC grade 2 with DCIS, right axillary lymph node biopsy negative, ER 100%, PR 40%, HER-2 negative, Ki-67 90%   02/16/2015 Surgery Rt Lumpectomy: IDC grade 3, 1.7 cm, with HG DCIS, 1/6 LN Positive, DCIS 0.1-0.2 cm anterior and post margin focally, ER 100%, PR 40%, HER-2 negative, Ki-67 90%, Mammaprint high risk, luminal type, (88% vs 76% RFS) 4yrROR 22%, 145yr9%    CHIEF COMPLIANT: patient is here to discuss the Mammaprint results  INTERVAL HISTORY: Alexandria GATTUSOs a [age]ear old with above-mentioned history of right breast cancer treated with lumpectomy and was found to have one positive axillary lymph node. She underwent mammogram testing and the test results came back as high risk. She is due to start cycle 1 of adriamycin and cytoxan today. She is nervous, but otherwise has few complaints. She is still tender to her port and right breast surgery sites. She is not using narcotics for pain at this time.   REVIEW OF SYSTEMS:   Constitutional: Denies fevers, chills or abnormal weight  loss Eyes: Denies blurriness of vision Ears, nose, mouth, throat, and face: Denies mucositis or sore throat Respiratory: Denies cough, dyspnea or wheezes Cardiovascular: Denies palpitation, chest discomfort Gastrointestinal:  Denies nausea, heartburn or change in bowel habits Skin: Denies abnormal skin rashes Lymphatics: Denies new lymphadenopathy or easy bruising Neurological:Denies numbness, tingling or new weaknesses Behavioral/Psych: Mood is stable, no new changes  Extremities: No lower extremity edema Breast: occasional throbbing in the right breast All other systems were reviewed with the patient and are negative.  I have reviewed the past medical history, past surgical history, social history and family history with the patient and they are unchanged from previous note.  ALLERGIES:  has No Known Allergies.  MEDICATIONS:  Current Outpatient Prescriptions  Medication Sig Dispense Refill  . dexamethasone (DECADRON) 4 MG tablet Take 1 tablet (4 mg total) by mouth daily. Take 1 tablets once a day on the day after chemo and then take 1 tablet once daily for 2 days 30 tablet 1  . ibuprofen (ADVIL,MOTRIN) 200 MG tablet Take 200 mg by mouth every 6 (six) hours as needed. Reported on 03/25/2015    . lidocaine-prilocaine (EMLA) cream Apply to affected area once 30 g 3  . ondansetron (ZOFRAN) 8 MG tablet Take 1 tablet (8 mg total) by mouth 2 (two) times daily as needed. Start on the third day after chemotherapy. 30 tablet 1  . prazosin (MINIPRESS) 1 MG capsule TAKE 1 CAPSULE BY MOUTH AT BEDTIME FOR NIGHTMARES  1  . ALPRAZolam (XANAX) 0.25 MG tablet Take 0.25 mg by mouth at bedtime  as needed for anxiety. Reported on 03/25/2015    . HYDROcodone-acetaminophen (NORCO) 5-325 MG tablet Take 1-2 tablets by mouth every 6 (six) hours as needed for moderate pain or severe pain. (Patient not taking: Reported on 03/25/2015) 30 tablet 0  . LORazepam (ATIVAN) 0.5 MG tablet Take 1 tablet (0.5 mg total) by mouth  at bedtime. (Patient not taking: Reported on 03/18/2015) 30 tablet 0  . nicotine (NICODERM CQ - DOSED IN MG/24 HOURS) 21 mg/24hr patch Place 21 mg onto the skin daily. Reported on 03/25/2015    . prochlorperazine (COMPAZINE) 10 MG tablet Take 1 tablet (10 mg total) by mouth every 6 (six) hours as needed (Nausea or vomiting). (Patient not taking: Reported on 03/25/2015) 30 tablet 1   No current facility-administered medications for this visit.    PHYSICAL EXAMINATION: ECOG PERFORMANCE STATUS: 1 - Symptomatic but completely ambulatory  Filed Vitals:   03/25/15 0917  BP: 108/73  Pulse: 72  Temp: 97.9 F (36.6 C)  Resp: 18   Filed Weights   03/25/15 0917  Weight: 116 lb 4.8 oz (52.753 kg)    GENERAL:alert, no distress and comfortable SKIN: skin color, texture, turgor are normal, no rashes or significant lesions EYES: normal, Conjunctiva are pink and non-injected, sclera clear OROPHARYNX:no exudate, no erythema and lips, buccal mucosa, and tongue normal  NECK: supple, thyroid normal size, non-tender, without nodularity LYMPH:  no palpable lymphadenopathy in the cervical, axillary or inguinal LUNGS: clear to auscultation and percussion with normal breathing effort HEART: regular rate & rhythm and no murmurs and no lower extremity edema ABDOMEN:abdomen soft, non-tender and normal bowel sounds MUSCULOSKELETAL:no cyanosis of digits and no clubbing  NEURO: alert & oriented x 3 with fluent speech, no focal motor/sensory deficits EXTREMITIES: No lower extremity edema  LABORATORY DATA:  I have reviewed the data as listed   Chemistry      Component Value Date/Time   NA 140 03/25/2015 0907   K 3.9 03/25/2015 0907   CO2 25 03/25/2015 0907   BUN 9.2 03/25/2015 0907   CREATININE 0.8 03/25/2015 0907      Component Value Date/Time   CALCIUM 9.0 03/25/2015 0907   ALKPHOS 71 03/25/2015 0907   AST 17 03/25/2015 0907   ALT 14 03/25/2015 0907   BILITOT <0.30 03/25/2015 0907      Lab  Results  Component Value Date   WBC 5.3 03/25/2015   HGB 12.9 03/25/2015   HCT 39.8 03/25/2015   MCV 81.6 03/25/2015   PLT 259 03/25/2015   NEUTROABS 3.0 03/25/2015   ASSESSMENT & PLAN:  Breast cancer of upper-outer quadrant of right female breast ([place]) Rt Lumpectomy 02/16/15: IDC grade 3, 1.7 cm, with HG DCIS, 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  I discussed with the patient about PREVENT trial and she will inform us if she has any interest in this trial.  Plan: 1. Adjuvant chemo with Dose dense AC followed by Abraxane weekly X 12 2. Foll by Adj XRT 3. Foll by Adj Tamoxifen vs anastrozole (after oophorectomy)  We briefly reviewed her antiemetic medicines and symptom management in general. RTC in 1 week for nadir visit.   No orders of the defined types were placed in this encounter.   The patient has a good understanding of the overall plan. she agrees with it. she will call with any problems that may develop  before the next visit here.   Laurie Panda, NP 03/25/2015

## 2015-03-25 NOTE — Patient Instructions (Signed)
[facility] Discharge Instructions for Patients Receiving Chemotherapy  Today you received the following chemotherapy agents cytoxan and adriamycin.  To help prevent nausea and vomiting after your treatment, we encourage you to take your nausea medication.  If you develop nausea and vomiting that is not controlled by your nausea medication, call the clinic.   BELOW ARE SYMPTOMS THAT SHOULD BE REPORTED IMMEDIATELY:  *FEVER GREATER THAN 100.5 F  *CHILLS WITH OR WITHOUT FEVER  NAUSEA AND VOMITING THAT IS NOT CONTROLLED WITH YOUR NAUSEA MEDICATION  *UNUSUAL SHORTNESS OF BREATH  *UNUSUAL BRUISING OR BLEEDING  TENDERNESS IN MOUTH AND THROAT WITH OR WITHOUT PRESENCE OF ULCERS  *URINARY PROBLEMS  *BOWEL PROBLEMS  UNUSUAL RASH Items with * indicate a potential emergency and should be followed up as soon as possible.  Feel free to call the clinic you have any questions or concerns. The clinic phone number is [phone].  Please show the [place] at check-in to the Emergency Department and triage nurse.

## 2015-03-25 NOTE — Progress Notes (Signed)
Patient received cytoxan over 45 minutes.  However near end of 45 minutes she experienced sinus burning.  Rate slowed even more and sx resolved.  Discussed with patient that at next tx ask for cytoxan to run over one hour. At discharged reviewed antiemetics with patient and boyfriend using teach back method.  Pharmacist had already discussed with them and had given them written iinstructions

## 2015-03-25 NOTE — Progress Notes (Signed)
Patient requesting a return appointment for neulasta injection on Saturday. She has young kids and is worried about device being knocked off or damaged. OK per charge RN to return at 1:30 on Saturday for neulasta. Order changed per pharmacy.

## 2015-03-26 ENCOUNTER — Ambulatory Visit (HOSPITAL_BASED_OUTPATIENT_CLINIC_OR_DEPARTMENT_OTHER): Payer: Medicaid Other

## 2015-03-26 VITALS — BP 112/86 | HR 73 | Temp 98.8°F | Resp 16

## 2015-03-26 DIAGNOSIS — C50411 Malignant neoplasm of upper-outer quadrant of right female breast: Secondary | ICD-10-CM | POA: Diagnosis not present

## 2015-03-26 MED ORDER — PEGFILGRASTIM INJECTION 6 MG/0.6ML ~~LOC~~
6.0000 mg | PREFILLED_SYRINGE | Freq: Once | SUBCUTANEOUS | Status: AC
  Administered 2015-03-26: 6 mg via SUBCUTANEOUS

## 2015-03-28 ENCOUNTER — Telehealth: Payer: Self-pay | Admitting: *Deleted

## 2015-03-28 NOTE — Telephone Encounter (Signed)
Left vm to assess pt after 1st AC on 3/10. Request return call. Contact information provided.

## 2015-04-01 ENCOUNTER — Ambulatory Visit (HOSPITAL_BASED_OUTPATIENT_CLINIC_OR_DEPARTMENT_OTHER): Payer: Medicaid Other | Admitting: Nurse Practitioner

## 2015-04-01 ENCOUNTER — Encounter: Payer: Self-pay | Admitting: *Deleted

## 2015-04-01 ENCOUNTER — Encounter: Payer: Self-pay | Admitting: Nurse Practitioner

## 2015-04-01 ENCOUNTER — Other Ambulatory Visit (HOSPITAL_BASED_OUTPATIENT_CLINIC_OR_DEPARTMENT_OTHER): Payer: Medicaid Other

## 2015-04-01 VITALS — BP 97/67 | HR 88 | Temp 98.4°F | Resp 17 | Ht 62.0 in | Wt 111.9 lb

## 2015-04-01 DIAGNOSIS — C773 Secondary and unspecified malignant neoplasm of axilla and upper limb lymph nodes: Secondary | ICD-10-CM | POA: Diagnosis not present

## 2015-04-01 DIAGNOSIS — R110 Nausea: Secondary | ICD-10-CM | POA: Diagnosis not present

## 2015-04-01 DIAGNOSIS — C50411 Malignant neoplasm of upper-outer quadrant of right female breast: Secondary | ICD-10-CM | POA: Diagnosis not present

## 2015-04-01 DIAGNOSIS — R5383 Other fatigue: Secondary | ICD-10-CM

## 2015-04-01 DIAGNOSIS — R112 Nausea with vomiting, unspecified: Secondary | ICD-10-CM

## 2015-04-01 DIAGNOSIS — M898X9 Other specified disorders of bone, unspecified site: Secondary | ICD-10-CM

## 2015-04-01 DIAGNOSIS — T451X5A Adverse effect of antineoplastic and immunosuppressive drugs, initial encounter: Secondary | ICD-10-CM

## 2015-04-01 DIAGNOSIS — K5900 Constipation, unspecified: Secondary | ICD-10-CM | POA: Diagnosis not present

## 2015-04-01 LAB — COMPREHENSIVE METABOLIC PANEL
ALT: 13 U/L (ref 0–55)
ANION GAP: 7 meq/L (ref 3–11)
AST: 13 U/L (ref 5–34)
Albumin: 3.9 g/dL (ref 3.5–5.0)
Alkaline Phosphatase: 94 U/L (ref 40–150)
BUN: 11.7 mg/dL (ref 7.0–26.0)
CALCIUM: 9.7 mg/dL (ref 8.4–10.4)
CHLORIDE: 105 meq/L (ref 98–109)
CO2: 27 meq/L (ref 22–29)
CREATININE: 0.8 mg/dL (ref 0.6–1.1)
EGFR: >90 mL/min/{1.73_m2} (ref >90)
Glucose: 104 mg/dl (ref 70–140)
POTASSIUM: 4.4 meq/L (ref 3.5–5.1)
Sodium: 138 mEq/L (ref 136–145)
Total Bilirubin: 0.42 mg/dL (ref 0.20–1.20)
Total Protein: 8.4 g/dL — ABNORMAL HIGH (ref 6.4–8.3)

## 2015-04-01 LAB — CBC WITH DIFFERENTIAL/PLATELET
BASO%: 0.3 % (ref 0.0–2.0)
BASOS ABS: 0 10*3/uL (ref 0.0–0.1)
EOS%: 2.4 % (ref 0.0–7.0)
Eosinophils Absolute: 0.1 10*3/uL (ref 0.0–0.5)
HEMATOCRIT: 41.8 % (ref 34.8–46.6)
HGB: 13.3 g/dL (ref 11.6–15.9)
LYMPH#: 0.9 10*3/uL (ref 0.9–3.3)
LYMPH%: 24.3 % (ref 14.0–49.7)
MCH: 26 pg (ref 25.1–34.0)
MCHC: 31.8 g/dL (ref 31.5–36.0)
MCV: 81.7 fL (ref 79.5–101.0)
MONO#: 0.2 10*3/uL (ref 0.1–0.9)
MONO%: 6.7 % (ref 0.0–14.0)
NEUT#: 2.4 10*3/uL (ref 1.5–6.5)
NEUT%: 66.3 % (ref 38.4–76.8)
PLATELETS: 208 10*3/uL (ref 145–400)
RBC: 5.11 10*6/uL (ref 3.70–5.45)
RDW: 13.8 % (ref 11.2–14.5)
WBC: 3.6 10*3/uL — ABNORMAL LOW (ref 3.9–10.3)

## 2015-04-01 NOTE — Progress Notes (Signed)
Patient Care Team: Kerin Perna, NP as PCP - General (Internal Medicine) Fanny Skates, MD as Consulting Physician (General Surgery) Nicholas Lose, MD as Consulting Physician (Hematology and Oncology) Kyung Rudd, MD as Consulting Physician (Radiation Oncology) Sylvan Cheese, NP as Nurse Practitioner (Hematology and Oncology)  DIAGNOSIS: Breast cancer of upper-outer quadrant of right female breast [facility])   Staging form: Breast, AJCC 7th Edition     Clinical stage from 01/19/2015: Stage IA (T1c, N0, M0) - Unsigned       Staging comments: Staged at breast conference on 1.4.17  SUMMARY OF ONCOLOGIC HISTORY:   Breast cancer of upper-outer quadrant of right female breast ([place])   12/31/2014 Mammogram Right breast over the palpable mass at 10:30 position: 1.5 x 1.1 x 1.3 cm with multiple abnormal appearing axillary lymph nodes largest 1.6 cm   01/05/2015 Initial Diagnosis Right breast biopsy: IDC grade 2 with DCIS, right axillary lymph node biopsy negative, ER 100%, PR 40%, HER-2 negative, Ki-67 90%   02/16/2015 Surgery Rt Lumpectomy: IDC grade 3, 1.7 cm, with HG DCIS, 1/6 LN Positive, DCIS 0.1-0.2 cm anterior and post margin focally, ER 100%, PR 40%, HER-2 negative, Ki-67 90%, Mammaprint high risk, luminal type, (88% vs 76% RFS) 67yrROR 22%, 164yr9%    CHIEF COMPLIANT: Adriamycin and Cytoxan cycle 1, day 8.   INTERVAL HISTORY: LaMANETTE DOTOs a 89ear old with above-mentioned history of right breast cancer treated with lumpectomy and was found to have one positive axillary lymph node. She started cycle 1 of adriamycin and cytoxan last week. She did well for the first 3 days, then fatigue and bone pain set in. She was advised to start claritin daily which helps. She did require one dose of hydrocodone. She has waves of nausea. She vomited twice, but is stable now. She is using compazine and zofran as directed. She is constipated but not taking any stool softeners. Her appetite is  down secondary to taste changes, but she is staying well hydrated. She denies mouth sores or rashes.   REVIEW OF SYSTEMS:   Constitutional: Denies fevers, chills or abnormal weight loss Eyes: Denies blurriness of vision Ears, nose, mouth, throat, and face: Denies mucositis or sore throat Respiratory: Denies cough, dyspnea or wheezes Cardiovascular: Denies palpitation, chest discomfort Gastrointestinal:  Denies heartburn, (+) nausea, (+) constipation Skin: Denies abnormal skin rashes Lymphatics: Denies new lymphadenopathy or easy bruising Neurological:Denies numbness, tingling or new weaknesses Behavioral/Psych: Mood is stable, no new changes  Extremities: No lower extremity edema Breast: occasional throbbing in the right breast All other systems were reviewed with the patient and are negative.  I have reviewed the past medical history, past surgical history, social history and family history with the patient and they are unchanged from previous note.  ALLERGIES:  has No Known Allergies.  MEDICATIONS:  Current Outpatient Prescriptions  Medication Sig Dispense Refill  . dexamethasone (DECADRON) 4 MG tablet Take 1 tablet (4 mg total) by mouth daily. Take 1 tablets once a day on the day after chemo and then take 1 tablet once daily for 2 days 30 tablet 1  . HYDROcodone-acetaminophen (NORCO) 5-325 MG tablet Take 1-2 tablets by mouth every 6 (six) hours as needed for moderate pain or severe pain. 30 tablet 0  . lidocaine-prilocaine (EMLA) cream Apply to affected area once 30 g 3  . nicotine (NICODERM CQ - DOSED IN MG/24 HOURS) 21 mg/24hr patch Place 21 mg onto the skin daily. Reported on 03/25/2015    .  ondansetron (ZOFRAN) 8 MG tablet Take 1 tablet (8 mg total) by mouth 2 (two) times daily as needed. Start on the third day after chemotherapy. 30 tablet 1  . prochlorperazine (COMPAZINE) 10 MG tablet Take 1 tablet (10 mg total) by mouth every 6 (six) hours as needed (Nausea or vomiting). 30  tablet 1  . ALPRAZolam (XANAX) 0.25 MG tablet Take 0.25 mg by mouth at bedtime as needed for anxiety. Reported on 04/01/2015    . ibuprofen (ADVIL,MOTRIN) 200 MG tablet Take 200 mg by mouth every 6 (six) hours as needed. Reported on 04/01/2015    . LORazepam (ATIVAN) 0.5 MG tablet Take 1 tablet (0.5 mg total) by mouth at bedtime. (Patient not taking: Reported on 03/18/2015) 30 tablet 0  . prazosin (MINIPRESS) 1 MG capsule Reported on 04/01/2015  1   No current facility-administered medications for this visit.    PHYSICAL EXAMINATION: ECOG PERFORMANCE STATUS: 1 - Symptomatic but completely ambulatory  Filed Vitals:   04/01/15 0929  BP: 97/67  Pulse: 88  Temp: 98.4 F (36.9 C)  Resp: 17   Filed Weights   04/01/15 0929  Weight: 111 lb 14.4 oz (50.758 kg)    GENERAL:alert, no distress and comfortable SKIN: skin color, texture, turgor are normal, no rashes or significant lesions EYES: normal, Conjunctiva are pink and non-injected, sclera clear OROPHARYNX:no exudate, no erythema and lips, buccal mucosa, and tongue normal  NECK: supple, thyroid normal size, non-tender, without nodularity LYMPH:  no palpable lymphadenopathy in the cervical, axillary or inguinal LUNGS: clear to auscultation and percussion with normal breathing effort HEART: regular rate & rhythm and no murmurs and no lower extremity edema ABDOMEN:abdomen soft, non-tender and normal bowel sounds MUSCULOSKELETAL:no cyanosis of digits and no clubbing  NEURO: alert & oriented x 3 with fluent speech, no focal motor/sensory deficits EXTREMITIES: No lower extremity edema  LABORATORY DATA:  I have reviewed the data as listed   Chemistry      Component Value Date/Time   NA 140 03/25/2015 0907   K 3.9 03/25/2015 0907   CO2 25 03/25/2015 0907   BUN 9.2 03/25/2015 0907   CREATININE 0.8 03/25/2015 0907      Component Value Date/Time   CALCIUM 9.0 03/25/2015 0907   ALKPHOS 71 03/25/2015 0907   AST 17 03/25/2015 0907   ALT 14  03/25/2015 0907   BILITOT <0.30 03/25/2015 0907      Lab Results  Component Value Date   WBC 3.6* 04/01/2015   HGB 13.3 04/01/2015   HCT 41.8 04/01/2015   MCV 81.7 04/01/2015   PLT 208 04/01/2015   NEUTROABS 2.4 04/01/2015   ASSESSMENT & PLAN:  Breast cancer of upper-outer quadrant of right female breast ([place]) Rt Lumpectomy 02/16/15: IDC grade 3, 1.7 cm, with HG DCIS, 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  I discussed with the patient about PREVENT trial and she will inform us if she has any interest in this trial.  Plan: 1. Adjuvant chemo with Dose dense AC followed by Abraxane weekly X 12 2. Foll by Adj XRT 3. Foll by Adj Tamoxifen vs anastrozole (after oophorectomy)  Current Treatment: Cycle 1 day 8 dose dense Adriamycin and Cytoxan  Chemotherapy Side Effects/Toxicities: 1. Nausea: controlled with zofran and compazine 2. Constipation: advised to begin a stool softeners BID and miralax daily PRN 3. Neulasta related bone pain: claritin daily  x 5 days starting on day of injection 4. Fatigue.   The labs were reviewed in detail and were entirely stable. RTC in 1 week for cycle 2 of adriamycin and cytoxan.    No orders of the defined types were placed in this encounter.   The patient has a good understanding of the overall plan. she agrees with it. she will call with any problems that may develop before the next visit here.   Laurie Panda, NP 04/01/2015

## 2015-04-08 ENCOUNTER — Ambulatory Visit (HOSPITAL_BASED_OUTPATIENT_CLINIC_OR_DEPARTMENT_OTHER): Payer: Medicaid Other | Admitting: Nurse Practitioner

## 2015-04-08 ENCOUNTER — Encounter: Payer: Self-pay | Admitting: *Deleted

## 2015-04-08 ENCOUNTER — Encounter: Payer: Self-pay | Admitting: Nurse Practitioner

## 2015-04-08 ENCOUNTER — Other Ambulatory Visit: Payer: Self-pay | Admitting: *Deleted

## 2015-04-08 ENCOUNTER — Ambulatory Visit (HOSPITAL_BASED_OUTPATIENT_CLINIC_OR_DEPARTMENT_OTHER): Payer: Medicaid Other

## 2015-04-08 ENCOUNTER — Other Ambulatory Visit (HOSPITAL_BASED_OUTPATIENT_CLINIC_OR_DEPARTMENT_OTHER): Payer: Medicaid Other

## 2015-04-08 VITALS — BP 109/73 | HR 93 | Temp 97.8°F | Resp 18 | Ht 62.0 in | Wt 113.6 lb

## 2015-04-08 VITALS — BP 105/75 | HR 85 | Temp 97.8°F | Resp 18

## 2015-04-08 DIAGNOSIS — C50411 Malignant neoplasm of upper-outer quadrant of right female breast: Secondary | ICD-10-CM

## 2015-04-08 DIAGNOSIS — Z5111 Encounter for antineoplastic chemotherapy: Secondary | ICD-10-CM | POA: Diagnosis present

## 2015-04-08 LAB — COMPREHENSIVE METABOLIC PANEL
ALT: 13 U/L (ref 0–55)
ANION GAP: 7 meq/L (ref 3–11)
AST: 16 U/L (ref 5–34)
Albumin: 3.6 g/dL (ref 3.5–5.0)
Alkaline Phosphatase: 85 U/L (ref 40–150)
BUN: 5.6 mg/dL — AB (ref 7.0–26.0)
CO2: 25 meq/L (ref 22–29)
CREATININE: 0.9 mg/dL (ref 0.6–1.1)
Calcium: 9.3 mg/dL (ref 8.4–10.4)
Chloride: 107 mEq/L (ref 98–109)
EGFR: >90 mL/min/{1.73_m2} (ref >90)
GLUCOSE: 124 mg/dL (ref 70–140)
Potassium: 3.7 mEq/L (ref 3.5–5.1)
Sodium: 139 mEq/L (ref 136–145)
TOTAL PROTEIN: 8 g/dL (ref 6.4–8.3)

## 2015-04-08 LAB — CBC WITH DIFFERENTIAL/PLATELET
BASO%: 0.4 % (ref 0.0–2.0)
Basophils Absolute: 0 10*3/uL (ref 0.0–0.1)
EOS%: 0.2 % (ref 0.0–7.0)
Eosinophils Absolute: 0 10*3/uL (ref 0.0–0.5)
HCT: 40.3 % (ref 34.8–46.6)
HGB: 13 g/dL (ref 11.6–15.9)
LYMPH#: 1 10*3/uL (ref 0.9–3.3)
LYMPH%: 16.3 % (ref 14.0–49.7)
MCH: 26.4 pg (ref 25.1–34.0)
MCHC: 32.4 g/dL (ref 31.5–36.0)
MCV: 81.7 fL (ref 79.5–101.0)
MONO#: 0.5 10*3/uL (ref 0.1–0.9)
MONO%: 7.4 % (ref 0.0–14.0)
NEUT%: 75.7 % (ref 38.4–76.8)
NEUTROS ABS: 4.7 10*3/uL (ref 1.5–6.5)
PLATELETS: 261 10*3/uL (ref 145–400)
RBC: 4.93 10*6/uL (ref 3.70–5.45)
RDW: 14.4 % (ref 11.2–14.5)
WBC: 6.2 10*3/uL (ref 3.9–10.3)

## 2015-04-08 MED ORDER — HYDROCODONE-ACETAMINOPHEN 5-325 MG PO TABS: 1.0000 | ORAL_TABLET | Freq: Four times a day (QID) | ORAL | Status: DC | PRN

## 2015-04-08 MED ORDER — SODIUM CHLORIDE 0.9% FLUSH
10.0000 mL | INTRAVENOUS | Status: DC | PRN
  Administered 2015-04-08: 10 mL
  Filled 2015-04-08: qty 10

## 2015-04-08 MED ORDER — DOXORUBICIN HCL CHEMO IV INJECTION 2 MG/ML
60.0000 mg/m2 | Freq: Once | INTRAVENOUS | Status: AC
  Administered 2015-04-08: 90 mg via INTRAVENOUS
  Filled 2015-04-08: qty 45

## 2015-04-08 MED ORDER — SODIUM CHLORIDE 0.9 % IV SOLN
Freq: Once | INTRAVENOUS | Status: AC
  Administered 2015-04-08: 10:00:00 via INTRAVENOUS

## 2015-04-08 MED ORDER — HYDROCODONE-ACETAMINOPHEN 5-325 MG PO TABS: 1.0000 | ORAL_TABLET | Freq: Four times a day (QID) | ORAL | Status: AC | PRN

## 2015-04-08 MED ORDER — SODIUM CHLORIDE 0.9 % IV SOLN
600.0000 mg/m2 | Freq: Once | INTRAVENOUS | Status: AC
  Administered 2015-04-08: 900 mg via INTRAVENOUS
  Filled 2015-04-08: qty 45

## 2015-04-08 MED ORDER — PALONOSETRON HCL INJECTION 0.25 MG/5ML
0.2500 mg | Freq: Once | INTRAVENOUS | Status: AC
  Administered 2015-04-08: 0.25 mg via INTRAVENOUS

## 2015-04-08 MED ORDER — SODIUM CHLORIDE 0.9 % IV SOLN
Freq: Once | INTRAVENOUS | Status: AC
  Administered 2015-04-08: 11:00:00 via INTRAVENOUS
  Filled 2015-04-08: qty 5

## 2015-04-08 MED ORDER — PALONOSETRON HCL INJECTION 0.25 MG/5ML
INTRAVENOUS | Status: AC
  Filled 2015-04-08: qty 5

## 2015-04-08 MED ORDER — HEPARIN SOD (PORK) LOCK FLUSH 100 UNIT/ML IV SOLN
500.0000 [IU] | Freq: Once | INTRAVENOUS | Status: AC | PRN
  Administered 2015-04-08: 500 [IU]
  Filled 2015-04-08: qty 5

## 2015-04-08 NOTE — Progress Notes (Signed)
Patient Care Team: Kerin Perna, NP as PCP - General (Internal Medicine) Fanny Skates, MD as Consulting Physician (General Surgery) Nicholas Lose, MD as Consulting Physician (Hematology and Oncology) Kyung Rudd, MD as Consulting Physician (Radiation Oncology) Sylvan Cheese, NP as Nurse Practitioner (Hematology and Oncology)  DIAGNOSIS: Breast cancer of upper-outer quadrant of right female breast [facility])   Staging form: Breast, AJCC 7th Edition     Clinical stage from 01/19/2015: Stage IA (T1c, N0, M0) - Unsigned       Staging comments: Staged at breast conference on 1.4.17  SUMMARY OF ONCOLOGIC HISTORY:   Breast cancer of upper-outer quadrant of right female breast ([place])   12/31/2014 Mammogram Right breast over the palpable mass at 10:30 position: 1.5 x 1.1 x 1.3 cm with multiple abnormal appearing axillary lymph nodes largest 1.6 cm   01/05/2015 Initial Diagnosis Right breast biopsy: IDC grade 2 with DCIS, right axillary lymph node biopsy negative, ER 100%, PR 40%, HER-2 negative, Ki-67 90%   02/16/2015 Surgery Rt Lumpectomy: IDC grade 3, 1.7 cm, with HG DCIS, 1/6 LN Positive, DCIS 0.1-0.2 cm anterior and post margin focally, ER 100%, PR 40%, HER-2 negative, Ki-67 90%, Mammaprint high risk, luminal type, (88% vs 76% RFS) 23yrROR 22%, 126yr9%    CHIEF COMPLIANT: Adriamycin and Cytoxan cycle 2, day 1.   INTERVAL HISTORY: Alexandria ESCAJEDAs a [age]ear old with above-mentioned history of right breast cancer treated with lumpectomy and was found to have one positive axillary lymph node. She is due for cycle 2 of adriamycin and cytoxan. She tolerated the first cycle well. She is no longer nauseous and she is moving her bowels well with the aid of dairy products. Her bone pain has now resolved, but earlier this week she had tremendous body aches. She did take 1 dose of norco for this. Her appetite is fair secondary to taste changes. She is drinking well. She denies mouth sores or  rashes.   REVIEW OF SYSTEMS:   Constitutional: Denies fevers, chills or abnormal weight loss Eyes: Denies blurriness of vision Ears, nose, mouth, throat, and face: Denies mucositis or sore throat Respiratory: Denies cough, dyspnea or wheezes Cardiovascular: Denies palpitation, chest discomfort Gastrointestinal:  Denies heartburn, (+) nausea, (+) constipation Skin: Denies abnormal skin rashes Lymphatics: Denies new lymphadenopathy or easy bruising Neurological:Denies numbness, tingling or new weaknesses Behavioral/Psych: Mood is stable, no new changes  Extremities: No lower extremity edema Breast: occasional throbbing in the right breast All other systems were reviewed with the patient and are negative.  I have reviewed the past medical history, past surgical history, social history and family history with the patient and they are unchanged from previous note.  ALLERGIES:  has No Known Allergies.  MEDICATIONS:  Current Outpatient Prescriptions  Medication Sig Dispense Refill  . ALPRAZolam (XANAX) 0.25 MG tablet Take 0.25 mg by mouth at bedtime as needed for anxiety. Reported on 04/01/2015    . dexamethasone (DECADRON) 4 MG tablet Take 1 tablet (4 mg total) by mouth daily. Take 1 tablets once a day on the day after chemo and then take 1 tablet once daily for 2 days 30 tablet 1  . lidocaine-prilocaine (EMLA) cream Apply to affected area once 30 g 3  . prazosin (MINIPRESS) 1 MG capsule Reported on 04/01/2015  1  . prochlorperazine (COMPAZINE) 10 MG tablet Take 1 tablet (10 mg total) by mouth every 6 (six) hours as needed (Nausea or vomiting). 30 tablet 1  . HYDROcodone-acetaminophen (NORCO) 5-325  MG tablet Take 1 tablet by mouth every 6 (six) hours as needed for moderate pain or severe pain. 30 tablet 0  . ibuprofen (ADVIL,MOTRIN) 200 MG tablet Take 200 mg by mouth every 6 (six) hours as needed. Reported on 04/08/2015    . LORazepam (ATIVAN) 0.5 MG tablet Take 1 tablet (0.5 mg total) by mouth  at bedtime. (Patient not taking: Reported on 03/18/2015) 30 tablet 0  . nicotine (NICODERM CQ - DOSED IN MG/24 HOURS) 21 mg/24hr patch Place 21 mg onto the skin daily. Reported on 04/08/2015    . ondansetron (ZOFRAN) 8 MG tablet Take 1 tablet (8 mg total) by mouth 2 (two) times daily as needed. Start on the third day after chemotherapy. (Patient not taking: Reported on 04/08/2015) 30 tablet 1   No current facility-administered medications for this visit.    PHYSICAL EXAMINATION: ECOG PERFORMANCE STATUS: 1 - Symptomatic but completely ambulatory  Filed Vitals:   04/08/15 0930  BP: 109/73  Pulse: 93  Temp: 97.8 F (36.6 C)  Resp: 18   Filed Weights   04/08/15 0930  Weight: 113 lb 9.6 oz (51.529 kg)    GENERAL:alert, no distress and comfortable SKIN: skin color, texture, turgor are normal, no rashes or significant lesions EYES: normal, Conjunctiva are pink and non-injected, sclera clear OROPHARYNX:no exudate, no erythema and lips, buccal mucosa, and tongue normal  NECK: supple, thyroid normal size, non-tender, without nodularity LYMPH:  no palpable lymphadenopathy in the cervical, axillary or inguinal LUNGS: clear to auscultation and percussion with normal breathing effort HEART: regular rate & rhythm and no murmurs and no lower extremity edema ABDOMEN:abdomen soft, non-tender and normal bowel sounds MUSCULOSKELETAL:no cyanosis of digits and no clubbing  NEURO: alert & oriented x 3 with fluent speech, no focal motor/sensory deficits EXTREMITIES: No lower extremity edema  LABORATORY DATA:  I have reviewed the data as listed   Chemistry      Component Value Date/Time   NA 138 04/01/2015 0915   K 4.4 04/01/2015 0915   CO2 27 04/01/2015 0915   BUN 11.7 04/01/2015 0915   CREATININE 0.8 04/01/2015 0915      Component Value Date/Time   CALCIUM 9.7 04/01/2015 0915   ALKPHOS 94 04/01/2015 0915   AST 13 04/01/2015 0915   ALT 13 04/01/2015 0915   BILITOT 0.42 04/01/2015 0915       Lab Results  Component Value Date   WBC 6.2 04/08/2015   HGB 13.0 04/08/2015   HCT 40.3 04/08/2015   MCV 81.7 04/08/2015   PLT 261 04/08/2015   NEUTROABS 4.7 04/08/2015   ASSESSMENT & PLAN:  Breast cancer of upper-outer quadrant of right female breast ([place]) Rt Lumpectomy 02/16/15: IDC grade 3, 1.7 cm, with HG DCIS, 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  I discussed with the patient about PREVENT trial and she will inform us if she has any interest in this trial.  Plan: 1. Adjuvant chemo with Dose dense AC followed by Abraxane weekly X 12 2. Foll by Adj XRT 3. Foll by Adj Tamoxifen vs anastrozole (after oophorectomy)  Current Treatment: Cycle 2 day 1 dose dense Adriamycin and Cytoxan  Chemotherapy Side Effects/Toxicities: 1. Nausea: controlled with zofran and compazine 2. Constipation: advised to begin a stool softener BID and miralax daily PRN 3. Neulasta related bone pain: claritin daily x 5 days starting on day of  injection. norco PRN for severe pain 4. Fatigue.   The labs were reviewed in detail and were entirely stable. RTC in 2 weeks for cycle 3 of adriamycin and cytoxan.    No orders of the defined types were placed in this encounter.   The patient has a good understanding of the overall plan. she agrees with it. she will call with any problems that may develop before the next visit here.   Laurie Panda, NP 04/08/2015

## 2015-04-08 NOTE — Patient Instructions (Signed)
[facility] Discharge Instructions for Patients Receiving Chemotherapy  Today you received the following chemotherapy agents: adriamycin and cytoxan.   To help prevent nausea and vomiting after your treatment, we encourage you to take your nausea medication, Compazine. Take 1 tablet (10 mg total) by mouth every 6 (six) hours as needed.  If you develop nausea and vomiting that is not controlled by your nausea medication, call the clinic.   BELOW ARE SYMPTOMS THAT SHOULD BE REPORTED IMMEDIATELY:  *FEVER GREATER THAN 100.5 F  *CHILLS WITH OR WITHOUT FEVER  NAUSEA AND VOMITING THAT IS NOT CONTROLLED WITH YOUR NAUSEA MEDICATION  *UNUSUAL SHORTNESS OF BREATH  *UNUSUAL BRUISING OR BLEEDING  TENDERNESS IN MOUTH AND THROAT WITH OR WITHOUT PRESENCE OF ULCERS  *URINARY PROBLEMS  *BOWEL PROBLEMS  UNUSUAL RASH Items with * indicate a potential emergency and should be followed up as soon as possible.  Feel free to call the clinic you have any questions or concerns. The clinic phone number is [phone].  Please show the [facility] at check-in to the Emergency Department and triage nurse.    Cyclophosphamide injection What is this medicine? CYCLOPHOSPHAMIDE (sye kloe FOSS fa mide) is a chemotherapy drug. It slows the growth of cancer cells. This medicine is used to treat many types of cancer like lymphoma, myeloma, leukemia, breast cancer, and ovarian cancer, to name a few. This medicine may be used for other purposes; ask your health care provider or pharmacist if you have questions. What should I tell my health care provider before I take this medicine? They need to know if you have any of these conditions: -blood disorders -history of other chemotherapy -infection -kidney disease -liver disease -recent or ongoing radiation therapy -tumors in the bone marrow -an unusual or allergic reaction to cyclophosphamide, other chemotherapy, other medicines, foods,  dyes, or preservatives -pregnant or trying to get pregnant -breast-feeding How should I use this medicine? This drug is usually given as an injection into a vein or muscle or by infusion into a vein. It is administered in a hospital or clinic by a specially trained health care professional. Talk to your pediatrician regarding the use of this medicine in children. Special care may be needed. Overdosage: If you think you have taken too much of this medicine contact a poison control center or emergency room at once. NOTE: This medicine is only for you. Do not share this medicine with others. What if I miss a dose? It is important not to miss your dose. Call your doctor or health care professional if you are unable to keep an appointment. What may interact with this medicine? This medicine may interact with the following medications: -amiodarone -amphotericin B -azathioprine -certain antiviral medicines for HIV or AIDS such as protease inhibitors (e.g., indinavir, ritonavir) and zidovudine -certain blood pressure medications such as benazepril, captopril, enalapril, fosinopril, lisinopril, moexipril, monopril, perindopril, quinapril, ramipril, trandolapril -certain cancer medications such as anthracyclines (e.g., daunorubicin, doxorubicin), busulfan, cytarabine, paclitaxel, pentostatin, tamoxifen, trastuzumab -certain diuretics such as chlorothiazide, chlorthalidone, hydrochlorothiazide, indapamide, metolazone -certain medicines that treat or prevent blood clots like warfarin -certain muscle relaxants such as succinylcholine -cyclosporine -etanercept -indomethacin -medicines to increase blood counts like filgrastim, pegfilgrastim, sargramostim -medicines used as general anesthesia -metronidazole -natalizumab This list may not describe all possible interactions. Give your health care provider a list of all the medicines, herbs, non-prescription drugs, or dietary supplements you use. Also tell  them if you smoke, drink alcohol, or use illegal drugs. Some items  may interact with your medicine. What should I watch for while using this medicine? Visit your doctor for checks on your progress. This drug may make you feel generally unwell. This is not uncommon, as chemotherapy can affect healthy cells as well as cancer cells. Report any side effects. Continue your course of treatment even though you feel ill unless your doctor tells you to stop. Drink water or other fluids as directed. Urinate often, even at night. In some cases, you may be given additional medicines to help with side effects. Follow all directions for their use. Call your doctor or health care professional for advice if you get a fever, chills or sore throat, or other symptoms of a cold or flu. Do not treat yourself. This drug decreases your body's ability to fight infections. Try to avoid being around people who are sick. This medicine may increase your risk to bruise or bleed. Call your doctor or health care professional if you notice any unusual bleeding. Be careful brushing and flossing your teeth or using a toothpick because you may get an infection or bleed more easily. If you have any dental work done, tell your dentist you are receiving this medicine. You may get drowsy or dizzy. Do not drive, use machinery, or do anything that needs mental alertness until you know how this medicine affects you. Do not become pregnant while taking this medicine or for 1 year after stopping it. Women should inform their doctor if they wish to become pregnant or think they might be pregnant. Men should not father a child while taking this medicine and for 4 months after stopping it. There is a potential for serious side effects to an unborn child. Talk to your health care professional or pharmacist for more information. Do not breast-feed an infant while taking this medicine. This medicine may interfere with the ability to have a child. This  medicine has caused ovarian failure in some women. This medicine has caused reduced sperm counts in some men. You should talk with your doctor or health care professional if you are concerned about your fertility. If you are going to have surgery, tell your doctor or health care professional that you have taken this medicine. What side effects may I notice from receiving this medicine? Side effects that you should report to your doctor or health care professional as soon as possible: -allergic reactions like skin rash, itching or hives, swelling of the face, lips, or tongue -low blood counts - this medicine may decrease the number of white blood cells, red blood cells and platelets. You may be at increased risk for infections and bleeding. -signs of infection - fever or chills, cough, sore throat, pain or difficulty passing urine -signs of decreased platelets or bleeding - bruising, pinpoint red spots on the skin, black, tarry stools, blood in the urine -signs of decreased red blood cells - unusually weak or tired, fainting spells, lightheadedness -breathing problems -dark urine -dizziness -palpitations -swelling of the ankles, feet, hands -trouble passing urine or change in the amount of urine -weight gain -yellowing of the eyes or skin Side effects that usually do not require medical attention (report to your doctor or health care professional if they continue or are bothersome): -changes in nail or skin color -hair loss -missed menstrual periods -mouth sores -nausea, vomiting This list may not describe all possible side effects. Call your doctor for medical advice about side effects. You may report side effects to FDA at 1-800-FDA-1088. Where should I keep  my medicine? This drug is given in a hospital or clinic and will not be stored at home. NOTE: This sheet is a summary. It may not cover all possible information. If you have questions about this medicine, talk to your doctor,  pharmacist, or health care provider.    2016, Elsevier/[employer] Standard. (2011-11-16 16:22:58)   Doxorubicin injection What is this medicine? DOXORUBICIN (dox oh ROO bi sin) is a chemotherapy drug. It is used to treat many kinds of cancer like Hodgkin's disease, leukemia, non-Hodgkin's lymphoma, neuroblastoma, sarcoma, and Wilms' tumor. It is also used to treat bladder cancer, breast cancer, lung cancer, ovarian cancer, stomach cancer, and thyroid cancer. This medicine may be used for other purposes; ask your health care provider or pharmacist if you have questions. What should I tell my health care provider before I take this medicine? They need to know if you have any of these conditions: -blood disorders -heart disease, recent heart attack -infection (especially a virus infection such as chickenpox, cold sores, or herpes) -irregular heartbeat -liver disease -recent or ongoing radiation therapy -an unusual or allergic reaction to doxorubicin, other chemotherapy agents, other medicines, foods, dyes, or preservatives -pregnant or trying to get pregnant -breast-feeding How should I use this medicine? This drug is given as an infusion into a vein. It is administered in a hospital or clinic by a specially trained health care professional. If you have pain, swelling, burning or any unusual feeling around the site of your injection, tell your health care professional right away. Talk to your pediatrician regarding the use of this medicine in children. Special care may be needed. Overdosage: If you think you have taken too much of this medicine contact a poison control center or emergency room at once. NOTE: This medicine is only for you. Do not share this medicine with others. What if I miss a dose? It is important not to miss your dose. Call your doctor or health care professional if you are unable to keep an appointment. What may interact with this medicine? Do not take this medicine with any  of the following medications: -cisapride -droperidol -halofantrine -pimozide -zidovudine This medicine may also interact with the following medications: -chloroquine -chlorpromazine -clarithromycin -cyclophosphamide -cyclosporine -erythromycin -medicines for depression, anxiety, or psychotic disturbances -medicines for irregular heart beat like amiodarone, bepridil, dofetilide, encainide, flecainide, propafenone, quinidine -medicines for seizures like ethotoin, fosphenytoin, phenytoin -medicines for nausea, vomiting like dolasetron, ondansetron, palonosetron -medicines to increase blood counts like filgrastim, pegfilgrastim, sargramostim -methadone -methotrexate -pentamidine -progesterone -vaccines -verapamil Talk to your doctor or health care professional before taking any of these medicines: -acetaminophen -aspirin -ibuprofen -ketoprofen -naproxen This list may not describe all possible interactions. Give your health care provider a list of all the medicines, herbs, non-prescription drugs, or dietary supplements you use. Also tell them if you smoke, drink alcohol, or use illegal drugs. Some items may interact with your medicine. What should I watch for while using this medicine? Your condition will be monitored carefully while you are receiving this medicine. You will need important blood work done while you are taking this medicine. This drug may make you feel generally unwell. This is not uncommon, as chemotherapy can affect healthy cells as well as cancer cells. Report any side effects. Continue your course of treatment even though you feel ill unless your doctor tells you to stop. Your urine may turn red for a few days after your dose. This is not blood. If your urine is dark or brown, call your doctor.  In some cases, you may be given additional medicines to help with side effects. Follow all directions for their use. Call your doctor or health care professional for advice  if you get a fever, chills or sore throat, or other symptoms of a cold or flu. Do not treat yourself. This drug decreases your body's ability to fight infections. Try to avoid being around people who are sick. This medicine may increase your risk to bruise or bleed. Call your doctor or health care professional if you notice any unusual bleeding. Be careful brushing and flossing your teeth or using a toothpick because you may get an infection or bleed more easily. If you have any dental work done, tell your dentist you are receiving this medicine. Avoid taking products that contain aspirin, acetaminophen, ibuprofen, naproxen, or ketoprofen unless instructed by your doctor. These medicines may hide a fever. Men and women of childbearing age should use effective birth control methods while using taking this medicine. Do not become pregnant while taking this medicine. There is a potential for serious side effects to an unborn child. Talk to your health care professional or pharmacist for more information. Do not breast-feed an infant while taking this medicine. Do not let others touch your urine or other body fluids for 5 days after each treatment with this medicine. Caregivers should wear latex gloves to avoid touching body fluids during this time. There is a maximum amount of this medicine you should receive throughout your life. The amount depends on the medical condition being treated and your overall health. Your doctor will watch how much of this medicine you receive in your lifetime. Tell your doctor if you have taken this medicine before. What side effects may I notice from receiving this medicine? Side effects that you should report to your doctor or health care professional as soon as possible: -allergic reactions like skin rash, itching or hives, swelling of the face, lips, or tongue -low blood counts - this medicine may decrease the number of white blood cells, red blood cells and platelets. You  may be at increased risk for infections and bleeding. -signs of infection - fever or chills, cough, sore throat, pain or difficulty passing urine -signs of decreased platelets or bleeding - bruising, pinpoint red spots on the skin, black, tarry stools, blood in the urine -signs of decreased red blood cells - unusually weak or tired, fainting spells, lightheadedness -breathing problems -chest pain -fast, irregular heartbeat -mouth sores -nausea, vomiting -pain, swelling, redness at site where injected -pain, tingling, numbness in the hands or feet -swelling of ankles, feet, or hands -unusual bleeding or bruising Side effects that usually do not require medical attention (report to your doctor or health care professional if they continue or are bothersome): -diarrhea -facial flushing -hair loss -loss of appetite -missed menstrual periods -nail discoloration or damage -red or watery eyes -red colored urine -stomach upset This list may not describe all possible side effects. Call your doctor for medical advice about side effects. You may report side effects to FDA at 1-800-FDA-1088. Where should I keep my medicine? This drug is given in a hospital or clinic and will not be stored at home. NOTE: This sheet is a summary. It may not cover all possible information. If you have questions about this medicine, talk to your doctor, pharmacist, or health care provider.    2016, [employer]vier/Gold Standard. (2012-04-29 09:54:34)

## 2015-04-09 ENCOUNTER — Ambulatory Visit (HOSPITAL_BASED_OUTPATIENT_CLINIC_OR_DEPARTMENT_OTHER): Payer: Medicaid Other

## 2015-04-09 VITALS — BP 103/69 | HR 95 | Temp 98.6°F | Resp 18

## 2015-04-09 DIAGNOSIS — Z5189 Encounter for other specified aftercare: Secondary | ICD-10-CM | POA: Diagnosis not present

## 2015-04-09 DIAGNOSIS — C50411 Malignant neoplasm of upper-outer quadrant of right female breast: Secondary | ICD-10-CM

## 2015-04-09 MED ORDER — PEGFILGRASTIM INJECTION 6 MG/0.6ML ~~LOC~~
6.0000 mg | PREFILLED_SYRINGE | Freq: Once | SUBCUTANEOUS | Status: AC
  Administered 2015-04-09: 6 mg via SUBCUTANEOUS

## 2015-04-09 NOTE — Patient Instructions (Signed)
Pegfilgrastim injection What is this medicine? PEGFILGRASTIM (PEG fil gra stim) is a long-acting granulocyte colony-stimulating factor that stimulates the growth of neutrophils, a type of white blood cell important in the body's fight against infection. It is used to reduce the incidence of fever and infection in patients with certain types of cancer who are receiving chemotherapy that affects the bone marrow, and to increase survival after being exposed to high doses of radiation. This medicine may be used for other purposes; ask your health care provider or pharmacist if you have questions. What should I tell my health care provider before I take this medicine? They need to know if you have any of these conditions: -kidney disease -latex allergy -ongoing radiation therapy -sickle cell disease -skin reactions to acrylic adhesives (On-Body Injector only) -an unusual or allergic reaction to pegfilgrastim, filgrastim, other medicines, foods, dyes, or preservatives -pregnant or trying to get pregnant -breast-feeding How should I use this medicine? This medicine is for injection under the skin. If you get this medicine at home, you will be taught how to prepare and give the pre-filled syringe or how to use the On-body Injector. Refer to the patient Instructions for Use for detailed instructions. Use exactly as directed. Take your medicine at regular intervals. Do not take your medicine more often than directed. It is important that you put your used needles and syringes in a special sharps container. Do not put them in a trash can. If you do not have a sharps container, call your pharmacist or healthcare provider to get one. Talk to your pediatrician regarding the use of this medicine in children. While this drug may be prescribed for selected conditions, precautions do apply. Overdosage: If you think you have taken too much of this medicine contact a poison control center or emergency room at  once. NOTE: This medicine is only for you. Do not share this medicine with others. What if I miss a dose? It is important not to miss your dose. Call your doctor or health care professional if you miss your dose. If you miss a dose due to an On-body Injector failure or leakage, a new dose should be administered as soon as possible using a single prefilled syringe for manual use. What may interact with this medicine? Interactions have not been studied. Give your health care provider a list of all the medicines, herbs, non-prescription drugs, or dietary supplements you use. Also tell them if you smoke, drink alcohol, or use illegal drugs. Some items may interact with your medicine. This list may not describe all possible interactions. Give your health care provider a list of all the medicines, herbs, non-prescription drugs, or dietary supplements you use. Also tell them if you smoke, drink alcohol, or use illegal drugs. Some items may interact with your medicine. What should I watch for while using this medicine? You may need blood work done while you are taking this medicine. If you are going to need a MRI, CT scan, or other procedure, tell your doctor that you are using this medicine (On-Body Injector only). What side effects may I notice from receiving this medicine? Side effects that you should report to your doctor or health care professional as soon as possible: -allergic reactions like skin rash, itching or hives, swelling of the face, lips, or tongue -dizziness -fever -pain, redness, or irritation at site where injected -pinpoint red spots on the skin -red or dark-brown urine -shortness of breath or breathing problems -stomach or side pain, or pain   at the shoulder -swelling -tiredness -trouble passing urine or change in the amount of urine Side effects that usually do not require medical attention (report to your doctor or health care professional if they continue or are  bothersome): -bone pain -muscle pain This list may not describe all possible side effects. Call your doctor for medical advice about side effects. You may report side effects to FDA at 1-800-FDA-1088. Where should I keep my medicine? Keep out of the reach of children. Store pre-filled syringes in a refrigerator between 2 and 8 degrees C (36 and 46 degrees F). Do not freeze. Keep in carton to protect from light. Throw away this medicine if it is left out of the refrigerator for more than 48 hours. Throw away any unused medicine after the expiration date. NOTE: This sheet is a summary. It may not cover all possible information. If you have questions about this medicine, talk to your doctor, pharmacist, or health care provider.    2016, Elsevier/Gold Standard. (2014-01-21 14:30:14)  

## 2015-04-19 ENCOUNTER — Telehealth: Payer: Self-pay | Admitting: Hematology and Oncology

## 2015-04-19 NOTE — Telephone Encounter (Signed)
Faxed records to [facility]

## 2015-04-21 ENCOUNTER — Other Ambulatory Visit: Payer: Self-pay

## 2015-04-22 ENCOUNTER — Ambulatory Visit (HOSPITAL_BASED_OUTPATIENT_CLINIC_OR_DEPARTMENT_OTHER): Payer: Medicaid Other

## 2015-04-22 ENCOUNTER — Encounter: Payer: Self-pay | Admitting: Hematology and Oncology

## 2015-04-22 ENCOUNTER — Encounter: Payer: Self-pay | Admitting: *Deleted

## 2015-04-22 ENCOUNTER — Ambulatory Visit (HOSPITAL_BASED_OUTPATIENT_CLINIC_OR_DEPARTMENT_OTHER): Payer: Medicaid Other | Admitting: Hematology and Oncology

## 2015-04-22 ENCOUNTER — Other Ambulatory Visit (HOSPITAL_BASED_OUTPATIENT_CLINIC_OR_DEPARTMENT_OTHER): Payer: Medicaid Other

## 2015-04-22 ENCOUNTER — Telehealth: Payer: Self-pay | Admitting: Hematology and Oncology

## 2015-04-22 VITALS — BP 104/67 | HR 92 | Temp 98.6°F | Resp 18 | Ht 62.0 in | Wt 112.0 lb

## 2015-04-22 DIAGNOSIS — M79672 Pain in left foot: Secondary | ICD-10-CM

## 2015-04-22 DIAGNOSIS — C50411 Malignant neoplasm of upper-outer quadrant of right female breast: Secondary | ICD-10-CM

## 2015-04-22 DIAGNOSIS — Z5111 Encounter for antineoplastic chemotherapy: Secondary | ICD-10-CM

## 2015-04-22 DIAGNOSIS — R202 Paresthesia of skin: Secondary | ICD-10-CM | POA: Diagnosis not present

## 2015-04-22 DIAGNOSIS — M79671 Pain in right foot: Secondary | ICD-10-CM

## 2015-04-22 DIAGNOSIS — M898X9 Other specified disorders of bone, unspecified site: Secondary | ICD-10-CM | POA: Diagnosis not present

## 2015-04-22 LAB — COMPREHENSIVE METABOLIC PANEL
ALT: 10 U/L (ref 0–55)
ANION GAP: 7 meq/L (ref 3–11)
AST: 16 U/L (ref 5–34)
Albumin: 3.6 g/dL (ref 3.5–5.0)
Alkaline Phosphatase: 83 U/L (ref 40–150)
BUN: 8.6 mg/dL (ref 7.0–26.0)
CHLORIDE: 109 meq/L (ref 98–109)
CO2: 25 meq/L (ref 22–29)
Calcium: 9.4 mg/dL (ref 8.4–10.4)
Creatinine: 1 mg/dL (ref 0.6–1.1)
EGFR: 87 mL/min/{1.73_m2} — AB (ref >90)
Glucose: 97 mg/dl (ref 70–140)
Potassium: 4.1 mEq/L (ref 3.5–5.1)
SODIUM: 141 meq/L (ref 136–145)
Total Bilirubin: <0.3 mg/dL (ref 0.20–1.20)
Total Protein: 7.7 g/dL (ref 6.4–8.3)

## 2015-04-22 LAB — CBC WITH DIFFERENTIAL/PLATELET
BASO%: 0.2 % (ref 0.0–2.0)
Basophils Absolute: 0 10*3/uL (ref 0.0–0.1)
EOS ABS: 0 10*3/uL (ref 0.0–0.5)
EOS%: 0 % (ref 0.0–7.0)
HCT: 38.4 % (ref 34.8–46.6)
HGB: 12.3 g/dL (ref 11.6–15.9)
LYMPH%: 8.6 % — AB (ref 14.0–49.7)
MCH: 26.1 pg (ref 25.1–34.0)
MCHC: 32 g/dL (ref 31.5–36.0)
MCV: 81.7 fL (ref 79.5–101.0)
MONO#: 1 10*3/uL — AB (ref 0.1–0.9)
MONO%: 13 % (ref 0.0–14.0)
NEUT#: 6 10*3/uL (ref 1.5–6.5)
NEUT%: 78.2 % — AB (ref 38.4–76.8)
PLATELETS: 291 10*3/uL (ref 145–400)
RBC: 4.7 10*6/uL (ref 3.70–5.45)
RDW: 14.6 % — ABNORMAL HIGH (ref 11.2–14.5)
WBC: 7.7 10*3/uL (ref 3.9–10.3)
lymph#: 0.7 10*3/uL — ABNORMAL LOW (ref 0.9–3.3)

## 2015-04-22 MED ORDER — DOXORUBICIN HCL CHEMO IV INJECTION 2 MG/ML
60.0000 mg/m2 | Freq: Once | INTRAVENOUS | Status: AC
  Administered 2015-04-22: 90 mg via INTRAVENOUS
  Filled 2015-04-22: qty 45

## 2015-04-22 MED ORDER — PALONOSETRON HCL INJECTION 0.25 MG/5ML
0.2500 mg | Freq: Once | INTRAVENOUS | Status: AC
  Administered 2015-04-22: 0.25 mg via INTRAVENOUS

## 2015-04-22 MED ORDER — PANTOPRAZOLE SODIUM 20 MG PO TBEC
20.0000 mg | DELAYED_RELEASE_TABLET | Freq: Every day | ORAL | Status: AC
Start: 2015-04-22 — End: ?

## 2015-04-22 MED ORDER — SODIUM CHLORIDE 0.9 % IV SOLN
Freq: Once | INTRAVENOUS | Status: AC
  Administered 2015-04-22: 11:00:00 via INTRAVENOUS
  Filled 2015-04-22: qty 5

## 2015-04-22 MED ORDER — SODIUM CHLORIDE 0.9% FLUSH
10.0000 mL | INTRAVENOUS | Status: DC | PRN
Start: 2015-04-22 — End: 2015-04-22
  Administered 2015-04-22: 10 mL
  Filled 2015-04-22: qty 10

## 2015-04-22 MED ORDER — SODIUM CHLORIDE 0.9 % IV SOLN
500.0000 mg/m2 | Freq: Once | INTRAVENOUS | Status: AC
  Administered 2015-04-22: 760 mg via INTRAVENOUS
  Filled 2015-04-22: qty 38

## 2015-04-22 MED ORDER — PALONOSETRON HCL INJECTION 0.25 MG/5ML
INTRAVENOUS | Status: AC
  Filled 2015-04-22: qty 5

## 2015-04-22 MED ORDER — SODIUM CHLORIDE 0.9 % IV SOLN
Freq: Once | INTRAVENOUS | Status: AC
  Administered 2015-04-22: 11:00:00 via INTRAVENOUS

## 2015-04-22 MED ORDER — HEPARIN SOD (PORK) LOCK FLUSH 100 UNIT/ML IV SOLN
500.0000 [IU] | Freq: Once | INTRAVENOUS | Status: AC | PRN
  Administered 2015-04-22: 500 [IU]
  Filled 2015-04-22: qty 5

## 2015-04-22 NOTE — Telephone Encounter (Signed)
appt made and avs printed °

## 2015-04-22 NOTE — Assessment & Plan Note (Signed)
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  Plan: 1. Adjuvant chemo with Dose dense AC followed by Abraxane weekly X 12 2. Foll by Adj XRT 3. Foll by Adj Tamoxifen vs anastrozole (after oophorectomy)  Current Treatment: Cycle 3 day 1 dose dense Adriamycin and Cytoxan  Chemotherapy Side Effects/Toxicities: 1. Nausea: controlled with zofran and compazine 2. Constipation: advised to begin a stool softener BID and miralax daily PRN 3. Neulasta related bone pain: claritin daily x 5 days starting on day of injection. norco PRN for severe pain 4. Fatigue.   The labs were reviewed in detail and were entirely stable. RTC in 2 weeks for cycle 4 of adriamycin and cytoxan.

## 2015-04-22 NOTE — Progress Notes (Signed)
Patient Care Team: Kerin Perna, NP as PCP - General (Internal Medicine) Fanny Skates, MD as Consulting Physician (General Surgery) Nicholas Lose, MD as Consulting Physician (Hematology and Oncology) Kyung Rudd, MD as Consulting Physician (Radiation Oncology) Sylvan Cheese, NP as Nurse Practitioner (Hematology and Oncology)  DIAGNOSIS: Breast cancer of upper-outer quadrant of right female breast [facility])   Staging form: Breast, AJCC 7th Edition     Clinical stage from 01/19/2015: Stage IA (T1c, N0, M0) - Unsigned       Staging comments: Staged at breast conference on 1.4.17  SUMMARY OF ONCOLOGIC HISTORY:   Breast cancer of upper-outer quadrant of right female breast ([place])   12/31/2014 Mammogram Right breast over the palpable mass at 10:30 position: 1.5 x 1.1 x 1.3 cm with multiple abnormal appearing axillary lymph nodes largest 1.6 cm   01/05/2015 Initial Diagnosis Right breast biopsy: IDC grade 2 with DCIS, right axillary lymph node biopsy negative, ER 100%, PR 40%, HER-2 negative, Ki-67 90%   02/16/2015 Surgery Rt Lumpectomy: IDC grade 3, 1.7 cm, with HG DCIS, 1/6 LN Positive, DCIS 0.1-0.2 cm anterior and post margin focally, ER 100%, PR 40%, HER-2 negative, Ki-67 90%, Mammaprint high risk, luminal type, (88% vs 76% RFS) 13yrROR 22%, 166yr9%   03/25/2015 -  Chemotherapy Dose dense Adriamycin and Cytoxan 4 followed by Abraxane weekly 12    CHIEF COMPLIANT: Cycle 3 dose dense Adriamycin Cytoxan  INTERVAL HISTORY: Alexandria Richards a [age]ear old left maxillary the above-mentioned history of right breast cancer treated with lumpectomy and is currently on adjuvant chemotherapy with dose dense Adriamycin and Cytoxan. Today cycle 3. After cycle 2 she had noticed increased pain and discomfort in bilateral feet as well as some tingling in the fingers. Otherwise she had not had any nausea vomiting or mouth sores. Denies any fevers or chills.  REVIEW OF SYSTEMS:   Constitutional:  Denies fevers, chills or abnormal weight loss Eyes: Denies blurriness of vision Ears, nose, mouth, throat, and face: Denies mucositis or sore throat Respiratory: Denies cough, dyspnea or wheezes Cardiovascular: Denies palpitation, chest discomfort Gastrointestinal:  Denies nausea, heartburn or change in bowel habits Skin: Denies abnormal skin rashes Lymphatics: Denies new lymphadenopathy or easy bruising Neurological:Denies numbness, tingling or new weaknesses Behavioral/Psych: Mood is stable, no new changes  Extremities: Pain in the lower extremities. Breast:  denies any pain or lumps or nodules in either breasts All other systems were reviewed with the patient and are negative.  I have reviewed the past medical history, past surgical history, social history and family history with the patient and they are unchanged from previous note.  ALLERGIES:  has No Known Allergies.  MEDICATIONS:  Current Outpatient Prescriptions  Medication Sig Dispense Refill  . ALPRAZolam (XANAX) 0.25 MG tablet Take 0.25 mg by mouth at bedtime as needed for anxiety. Reported on 04/01/2015    . dexamethasone (DECADRON) 4 MG tablet Take 1 tablet (4 mg total) by mouth daily. Take 1 tablets once a day on the day after chemo and then take 1 tablet once daily for 2 days 30 tablet 1  . HYDROcodone-acetaminophen (NORCO) 5-325 MG tablet Take 1 tablet by mouth every 6 (six) hours as needed for moderate pain or severe pain. 30 tablet 0  . ibuprofen (ADVIL,MOTRIN) 200 MG tablet Take 200 mg by mouth every 6 (six) hours as needed. Reported on 04/08/2015    . lidocaine-prilocaine (EMLA) cream Apply to affected area once 30 g 3  . LORazepam (ATIVAN)  0.5 MG tablet Take 1 tablet (0.5 mg total) by mouth at bedtime. (Patient not taking: Reported on 03/18/2015) 30 tablet 0  . nicotine (NICODERM CQ - DOSED IN MG/24 HOURS) 21 mg/24hr patch Place 21 mg onto the skin daily. Reported on 04/08/2015    . ondansetron (ZOFRAN) 8 MG tablet Take 1  tablet (8 mg total) by mouth 2 (two) times daily as needed. Start on the third day after chemotherapy. (Patient not taking: Reported on 04/08/2015) 30 tablet 1  . pantoprazole (PROTONIX) 20 MG tablet Take 1 tablet (20 mg total) by mouth daily. 14 tablet 0  . prazosin (MINIPRESS) 1 MG capsule Reported on 04/01/2015  1  . prochlorperazine (COMPAZINE) 10 MG tablet Take 1 tablet (10 mg total) by mouth every 6 (six) hours as needed (Nausea or vomiting). 30 tablet 1   No current facility-administered medications for this visit.   Facility-Administered Medications Ordered in Other Visits  Medication Dose Route Frequency Provider Last Rate Last Dose  . sodium chloride flush (NS) 0.9 % injection 10 mL  10 mL Intracatheter PRN Nicholas Lose, MD   10 mL at 04/22/15 1227    PHYSICAL EXAMINATION: ECOG PERFORMANCE STATUS: 1 - Symptomatic but completely ambulatory  Filed Vitals:   04/22/15 0946  BP: 104/67  Pulse: 92  Temp: 98.6 F (37 C)  Resp: 18   Filed Weights   04/22/15 0946  Weight: 112 lb (50.803 kg)    GENERAL:alert, no distress and comfortable SKIN: skin color, texture, turgor are normal, no rashes or significant lesions EYES: normal, Conjunctiva are pink and non-injected, sclera clear OROPHARYNX:no exudate, no erythema and lips, buccal mucosa, and tongue normal  NECK: supple, thyroid normal size, non-tender, without nodularity LYMPH:  no palpable lymphadenopathy in the cervical, axillary or inguinal LUNGS: clear to auscultation and percussion with normal breathing effort HEART: regular rate & rhythm and no murmurs and no lower extremity edema ABDOMEN:abdomen soft, non-tender and normal bowel sounds MUSCULOSKELETAL:no cyanosis of digits and no clubbing  NEURO: alert & oriented x 3 with fluent speech, no focal motor/sensory deficits EXTREMITIES: No lower extremity edema, complaining of pain   LABORATORY DATA:  I have reviewed the data as listed   Chemistry      Component Value  Date/Time   NA 141 04/22/2015 0927   K 4.1 04/22/2015 0927   CO2 25 04/22/2015 0927   BUN 8.6 04/22/2015 0927   CREATININE 1.0 04/22/2015 0927      Component Value Date/Time   CALCIUM 9.4 04/22/2015 0927   ALKPHOS 83 04/22/2015 0927   AST 16 04/22/2015 0927   ALT 10 04/22/2015 0927   BILITOT <0.30 04/22/2015 0927       Lab Results  Component Value Date   WBC 7.7 04/22/2015   HGB 12.3 04/22/2015   HCT 38.4 04/22/2015   MCV 81.7 04/22/2015   PLT 291 04/22/2015   NEUTROABS 6.0 04/22/2015   ASSESSMENT & PLAN:  Breast cancer of upper-outer quadrant of right female breast ([place]) Rt Lumpectomy 02/16/15: IDC grade 3, 1.7 cm, with HG DCIS, 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  Plan: 1. Adjuvant chemo with Dose dense AC followed by Abraxane weekly X 12 2. Foll by Adj XRT 3. Foll by Adj Tamoxifen vs anastrozole (after oophorectomy)  Current Treatment: Cycle 3 day 1 dose dense Adriamycin and Cytoxan  Chemotherapy Side Effects/Toxicities:  1. Nausea: controlled with zofran and compazine 2. Constipation: advised to begin a stool softener BID and miralax daily PRN 3. Neulasta related bone pain: claritin daily x 5 days starting on day of injection. norco PRN for severe pain 4. Fatigue.  5. Pain in bilateral feet with mild tingling of the hands: I decreased the dosage of Cytoxan.  The labs were reviewed in detail and were entirely stable. RTC in 2 weeks for cycle 4 of adriamycin and cytoxan.    No orders of the defined types were placed in this encounter.   The patient has a good understanding of the overall plan. she agrees with it. she will call with any problems that may develop before the next visit here.   Rulon Eisenmenger, MD 04/22/2015

## 2015-04-22 NOTE — Patient Instructions (Signed)
[facility] Discharge Instructions for Patients Receiving Chemotherapy  Today you received the following chemotherapy agents Adriamycin/Cytoxan.  To help prevent nausea and vomiting after your treatment, we encourage you to take your nausea medication as prescribed.    If you develop nausea and vomiting that is not controlled by your nausea medication, call the clinic.   BELOW ARE SYMPTOMS THAT SHOULD BE REPORTED IMMEDIATELY:  *FEVER GREATER THAN 100.5 F  *CHILLS WITH OR WITHOUT FEVER  NAUSEA AND VOMITING THAT IS NOT CONTROLLED WITH YOUR NAUSEA MEDICATION  *UNUSUAL SHORTNESS OF BREATH  *UNUSUAL BRUISING OR BLEEDING  TENDERNESS IN MOUTH AND THROAT WITH OR WITHOUT PRESENCE OF ULCERS  *URINARY PROBLEMS  *BOWEL PROBLEMS  UNUSUAL RASH Items with * indicate a potential emergency and should be followed up as soon as possible.  Feel free to call the clinic you have any questions or concerns. The clinic phone number is [phone].  Please show the CHEMO ALERT CARD at check-in to the Emergency Department and triage nurse.   

## 2015-04-23 ENCOUNTER — Ambulatory Visit (HOSPITAL_BASED_OUTPATIENT_CLINIC_OR_DEPARTMENT_OTHER): Payer: Medicaid Other

## 2015-04-23 DIAGNOSIS — C50411 Malignant neoplasm of upper-outer quadrant of right female breast: Secondary | ICD-10-CM | POA: Diagnosis present

## 2015-04-23 MED ORDER — PEGFILGRASTIM INJECTION 6 MG/0.6ML ~~LOC~~
6.0000 mg | PREFILLED_SYRINGE | Freq: Once | SUBCUTANEOUS | Status: AC
  Administered 2015-04-23: 6 mg via SUBCUTANEOUS

## 2015-05-02 ENCOUNTER — Telehealth: Payer: Self-pay | Admitting: Hematology and Oncology

## 2015-05-02 NOTE — Telephone Encounter (Signed)
lvm to informpt of 4/21 appt time change to 11 am due to VG half day

## 2015-05-05 ENCOUNTER — Ambulatory Visit (HOSPITAL_COMMUNITY): Admission: RE | Admit: 2015-05-05 | Payer: Medicaid Other | Source: Ambulatory Visit

## 2015-05-05 ENCOUNTER — Ambulatory Visit (HOSPITAL_BASED_OUTPATIENT_CLINIC_OR_DEPARTMENT_OTHER)
Admission: RE | Admit: 2015-05-05 | Discharge: 2015-05-05 | Disposition: A | Discharge status: Home / Self Care | Payer: Medicaid Other | Source: Ambulatory Visit | Attending: Hematology and Oncology | Admitting: Hematology and Oncology

## 2015-05-05 ENCOUNTER — Other Ambulatory Visit: Payer: Self-pay | Admitting: Hematology and Oncology

## 2015-05-05 ENCOUNTER — Ambulatory Visit (HOSPITAL_COMMUNITY)
Admission: RE | Admit: 2015-05-05 | Discharge: 2015-05-05 | Disposition: A | Discharge status: Home / Self Care | Payer: Medicaid Other | Source: Ambulatory Visit | Attending: Primary Care | Admitting: Primary Care

## 2015-05-05 VITALS — BP 102/64 | HR 73 | Wt 110.2 lb

## 2015-05-05 DIAGNOSIS — C50919 Malignant neoplasm of unspecified site of unspecified female breast: Secondary | ICD-10-CM

## 2015-05-05 DIAGNOSIS — Z1509 Genetic susceptibility to other malignant neoplasm: Secondary | ICD-10-CM

## 2015-05-05 DIAGNOSIS — Z79899 Other long term (current) drug therapy: Secondary | ICD-10-CM | POA: Diagnosis not present

## 2015-05-05 DIAGNOSIS — Z1502 Genetic susceptibility to malignant neoplasm of ovary: Secondary | ICD-10-CM

## 2015-05-05 DIAGNOSIS — Z1589 Genetic susceptibility to other disease: Secondary | ICD-10-CM

## 2015-05-05 DIAGNOSIS — Z170 Estrogen receptor positive status [ER+]: Secondary | ICD-10-CM | POA: Diagnosis not present

## 2015-05-05 DIAGNOSIS — Z87891 Personal history of nicotine dependence: Secondary | ICD-10-CM | POA: Insufficient documentation

## 2015-05-05 DIAGNOSIS — C50911 Malignant neoplasm of unspecified site of right female breast: Secondary | ICD-10-CM | POA: Diagnosis present

## 2015-05-05 NOTE — Patient Instructions (Signed)
We will contact you in 6 months to schedule your next appointment and echocardiogram  

## 2015-05-05 NOTE — Progress Notes (Signed)
Advanced Heart Failure Medication Review by a Pharmacist  Does the patient  feel that his/her medications are working for him/her?  yes  Has the patient been experiencing any side effects to the medications prescribed?  no  Does the patient measure his/her own blood pressure or blood glucose at home?  no   Does the patient have any problems obtaining medications due to transportation or finances?   no  Understanding of regimen: good Understanding of indications: good Potential of compliance: good Patient understands to avoid NSAIDs. Patient understands to avoid decongestants.  Issues to address at subsequent visits: None   Pharmacist comments:  Alexandria Richards is a pleasant 40 yo F presenting without a medication list but with excellent recall of her regimen including dosages. She reports good compliance and did not have any specific medication-related questions or concerns for me at this time.   Ruta Hinds. Velva Harman, PharmD, BCPS, CPP Clinical Pharmacist Pager: [phone] Phone: [phone] 05/05/2015 10:39 AM      Time with patient: 8 minutes Preparation and documentation time: 2 minutes Total time: 10 minutes

## 2015-05-05 NOTE — Progress Notes (Signed)
  Echocardiogram 2D Echocardiogram has been performed.  Darlina Sicilian M 05/05/2015, 9:58 AM

## 2015-05-06 ENCOUNTER — Other Ambulatory Visit (HOSPITAL_BASED_OUTPATIENT_CLINIC_OR_DEPARTMENT_OTHER): Payer: Medicaid Other

## 2015-05-06 ENCOUNTER — Encounter: Payer: Self-pay | Admitting: Hematology and Oncology

## 2015-05-06 ENCOUNTER — Ambulatory Visit: Payer: Medicaid Other

## 2015-05-06 ENCOUNTER — Ambulatory Visit (HOSPITAL_BASED_OUTPATIENT_CLINIC_OR_DEPARTMENT_OTHER): Payer: Medicaid Other | Admitting: Hematology and Oncology

## 2015-05-06 ENCOUNTER — Encounter: Payer: Self-pay | Admitting: *Deleted

## 2015-05-06 ENCOUNTER — Ambulatory Visit (HOSPITAL_BASED_OUTPATIENT_CLINIC_OR_DEPARTMENT_OTHER): Payer: Medicaid Other

## 2015-05-06 ENCOUNTER — Telehealth: Payer: Self-pay | Admitting: Hematology and Oncology

## 2015-05-06 VITALS — BP 108/68 | HR 99 | Temp 98.4°F | Resp 18 | Wt 106.9 lb

## 2015-05-06 DIAGNOSIS — K5900 Constipation, unspecified: Secondary | ICD-10-CM | POA: Diagnosis not present

## 2015-05-06 DIAGNOSIS — M898X9 Other specified disorders of bone, unspecified site: Secondary | ICD-10-CM

## 2015-05-06 DIAGNOSIS — R110 Nausea: Secondary | ICD-10-CM

## 2015-05-06 DIAGNOSIS — C50411 Malignant neoplasm of upper-outer quadrant of right female breast: Secondary | ICD-10-CM

## 2015-05-06 DIAGNOSIS — M79671 Pain in right foot: Secondary | ICD-10-CM

## 2015-05-06 DIAGNOSIS — M79672 Pain in left foot: Secondary | ICD-10-CM | POA: Diagnosis not present

## 2015-05-06 DIAGNOSIS — Z5111 Encounter for antineoplastic chemotherapy: Secondary | ICD-10-CM | POA: Diagnosis not present

## 2015-05-06 LAB — CBC WITH DIFFERENTIAL/PLATELET
BASO%: 0.5 % (ref 0.0–2.0)
BASOS ABS: 0 10*3/uL (ref 0.0–0.1)
EOS%: 0.1 % (ref 0.0–7.0)
Eosinophils Absolute: 0 10*3/uL (ref 0.0–0.5)
HEMATOCRIT: 38.5 % (ref 34.8–46.6)
HGB: 12.4 g/dL (ref 11.6–15.9)
LYMPH#: 0.7 10*3/uL — AB (ref 0.9–3.3)
LYMPH%: 8.2 % — ABNORMAL LOW (ref 14.0–49.7)
MCH: 26 pg (ref 25.1–34.0)
MCHC: 32.1 g/dL (ref 31.5–36.0)
MCV: 81 fL (ref 79.5–101.0)
MONO#: 1.1 10*3/uL — ABNORMAL HIGH (ref 0.1–0.9)
MONO%: 12.1 % (ref 0.0–14.0)
NEUT#: 7.1 10*3/uL — ABNORMAL HIGH (ref 1.5–6.5)
NEUT%: 79.1 % — AB (ref 38.4–76.8)
PLATELETS: 373 10*3/uL (ref 145–400)
RBC: 4.76 10*6/uL (ref 3.70–5.45)
RDW: 14.6 % — ABNORMAL HIGH (ref 11.2–14.5)
WBC: 8.9 10*3/uL (ref 3.9–10.3)

## 2015-05-06 LAB — COMPREHENSIVE METABOLIC PANEL
ALT: 12 U/L (ref 0–55)
ANION GAP: 13 meq/L — AB (ref 3–11)
AST: 17 U/L (ref 5–34)
Albumin: 3.8 g/dL (ref 3.5–5.0)
Alkaline Phosphatase: 91 U/L (ref 40–150)
BUN: 6.6 mg/dL — ABNORMAL LOW (ref 7.0–26.0)
CALCIUM: 9.5 mg/dL (ref 8.4–10.4)
CHLORIDE: 106 meq/L (ref 98–109)
CO2: 23 mEq/L (ref 22–29)
Creatinine: 0.9 mg/dL (ref 0.6–1.1)
Glucose: 89 mg/dl (ref 70–140)
POTASSIUM: 3.3 meq/L — AB (ref 3.5–5.1)
Sodium: 142 mEq/L (ref 136–145)
Total Bilirubin: <0.3 mg/dL (ref 0.20–1.20)
Total Protein: 7.9 g/dL (ref 6.4–8.3)

## 2015-05-06 MED ORDER — SODIUM CHLORIDE 0.9 % IV SOLN
Freq: Once | INTRAVENOUS | Status: AC
  Administered 2015-05-06: 13:00:00 via INTRAVENOUS

## 2015-05-06 MED ORDER — PALONOSETRON HCL INJECTION 0.25 MG/5ML
0.2500 mg | Freq: Once | INTRAVENOUS | Status: AC
  Administered 2015-05-06: 0.25 mg via INTRAVENOUS

## 2015-05-06 MED ORDER — PEGFILGRASTIM 6 MG/0.6ML ~~LOC~~ PSKT
6.0000 mg | PREFILLED_SYRINGE | Freq: Once | SUBCUTANEOUS | Status: AC
  Administered 2015-05-06: 6 mg via SUBCUTANEOUS
  Filled 2015-05-06: qty 0.6

## 2015-05-06 MED ORDER — SODIUM CHLORIDE 0.9 % IV SOLN
500.0000 mg/m2 | Freq: Once | INTRAVENOUS | Status: AC
  Administered 2015-05-06: 760 mg via INTRAVENOUS
  Filled 2015-05-06: qty 38

## 2015-05-06 MED ORDER — HEPARIN SOD (PORK) LOCK FLUSH 100 UNIT/ML IV SOLN
500.0000 [IU] | Freq: Once | INTRAVENOUS | Status: AC | PRN
  Administered 2015-05-06: 500 [IU]
  Filled 2015-05-06: qty 5

## 2015-05-06 MED ORDER — SODIUM CHLORIDE 0.9% FLUSH
10.0000 mL | INTRAVENOUS | Status: DC | PRN
  Administered 2015-05-06: 10 mL
  Filled 2015-05-06: qty 10

## 2015-05-06 MED ORDER — DOXORUBICIN HCL CHEMO IV INJECTION 2 MG/ML
60.0000 mg/m2 | Freq: Once | INTRAVENOUS | Status: AC
  Administered 2015-05-06: 90 mg via INTRAVENOUS
  Filled 2015-05-06: qty 45

## 2015-05-06 MED ORDER — PALONOSETRON HCL INJECTION 0.25 MG/5ML
INTRAVENOUS | Status: AC
  Filled 2015-05-06: qty 5

## 2015-05-06 MED ORDER — SODIUM CHLORIDE 0.9 % IV SOLN
Freq: Once | INTRAVENOUS | Status: AC
  Administered 2015-05-06: 13:00:00 via INTRAVENOUS
  Filled 2015-05-06: qty 5

## 2015-05-06 NOTE — Patient Instructions (Signed)
[facility] Discharge Instructions for Patients Receiving Chemotherapy  Today you received the following chemotherapy agents Adriamycin/Cytoxan.  To help prevent nausea and vomiting after your treatment, we encourage you to take your nausea medication as prescribed.    If you develop nausea and vomiting that is not controlled by your nausea medication, call the clinic.   BELOW ARE SYMPTOMS THAT SHOULD BE REPORTED IMMEDIATELY:  *FEVER GREATER THAN 100.5 F  *CHILLS WITH OR WITHOUT FEVER  NAUSEA AND VOMITING THAT IS NOT CONTROLLED WITH YOUR NAUSEA MEDICATION  *UNUSUAL SHORTNESS OF BREATH  *UNUSUAL BRUISING OR BLEEDING  TENDERNESS IN MOUTH AND THROAT WITH OR WITHOUT PRESENCE OF ULCERS  *URINARY PROBLEMS  *BOWEL PROBLEMS  UNUSUAL RASH Items with * indicate a potential emergency and should be followed up as soon as possible.  Feel free to call the clinic you have any questions or concerns. The clinic phone number is [phone].  Please show the CHEMO ALERT CARD at check-in to the Emergency Department and triage nurse.   

## 2015-05-06 NOTE — Progress Notes (Signed)
Patient Care Team: Kerin Perna, NP as PCP - General (Internal Medicine) Fanny Skates, MD as Consulting Physician (General Surgery) Nicholas Lose, MD as Consulting Physician (Hematology and Oncology) Kyung Rudd, MD as Consulting Physician (Radiation Oncology) Sylvan Cheese, NP as Nurse Practitioner (Hematology and Oncology)  DIAGNOSIS: Breast cancer of upper-outer quadrant of right female breast [facility])   Staging form: Breast, AJCC 7th Edition     Clinical stage from 01/19/2015: Stage IA (T1c, N0, M0) - Unsigned       Staging comments: Staged at breast conference on 1.4.17    SUMMARY OF ONCOLOGIC HISTORY:   Breast cancer of upper-outer quadrant of right female breast ([place])   12/31/2014 Mammogram Right breast over the palpable mass at 10:30 position: 1.5 x 1.1 x 1.3 cm with multiple abnormal appearing axillary lymph nodes largest 1.6 cm   01/05/2015 Initial Diagnosis Right breast biopsy: IDC grade 2 with DCIS, right axillary lymph node biopsy negative, ER 100%, PR 40%, HER-2 negative, Ki-67 90%   02/16/2015 Surgery Rt Lumpectomy: IDC grade 3, 1.7 cm, with HG DCIS, 1/6 LN Positive, DCIS 0.1-0.2 cm anterior and post margin focally, ER 100%, PR 40%, HER-2 negative, Ki-67 90%, Mammaprint high risk, luminal type, (88% vs 76% RFS) 10yrROR 22%, 15yr9%   03/25/2015 -  Chemotherapy Dose dense Adriamycin and Cytoxan 4 followed by Abraxane weekly 12    CHIEF COMPLIANT:  Cycle 4 of dose dense Adriamycin and Cytoxan  INTERVAL HISTORY: LaWANETTA FUNDERBURKEs a  [age]ear old Afro-American lady with above-mentioned history of right breast cancer currently on adjuvant chemotherapy with dose dense Adriamycin and Cytoxan. Today is cycle 4 of treatment and she appears to be tolerating the chemotherapy moderately well. She complained of some nausea which has improved with Zofran. She does have constipation which is improved with chocolate milk. She has lost about 4 pounds because of decreased  appetite and taste.  REVIEW OF SYSTEMS:   Constitutional: Denies fevers, chills or abnormal weight loss Eyes: Denies blurriness of vision Ears, nose, mouth, throat, and face: Denies mucositis or sore throat Respiratory: Denies cough, dyspnea or wheezes Cardiovascular: Denies palpitation, chest discomfort Gastrointestinal:  Denies nausea, heartburn or change in bowel habits Skin: Denies abnormal skin rashes Lymphatics: Denies new lymphadenopathy or easy bruising Neurological:Denies numbness, tingling or new weaknesses Behavioral/Psych: Mood is stable, no new changes  Extremities: No lower extremity edema Breast:  denies any pain or lumps or nodules in either breasts All other systems were reviewed with the patient and are negative.  I have reviewed the past medical history, past surgical history, social history and family history with the patient and they are unchanged from previous note.  ALLERGIES:  has No Known Allergies.  MEDICATIONS:  Current Outpatient Prescriptions  Medication Sig Dispense Refill  . ALPRAZolam (XANAX) 0.25 MG tablet Take 0.25 mg by mouth at bedtime as needed for anxiety. Reported on 04/01/2015    . dexamethasone (DECADRON) 4 MG tablet Take 1 tablet (4 mg total) by mouth daily. Take 1 tablets once a day on the day after chemo and then take 1 tablet once daily for 2 days 30 tablet 1  . HYDROcodone-acetaminophen (NORCO) 5-325 MG tablet Take 1 tablet by mouth every 6 (six) hours as needed for moderate pain or severe pain. 30 tablet 0  . ibuprofen (ADVIL,MOTRIN) 200 MG tablet Take 200 mg by mouth every 6 (six) hours as needed for fever or headache. Reported on 04/08/2015    . lidocaine-prilocaine (EMLA) cream  Apply to affected area once 30 g 3  . nicotine (NICODERM CQ - DOSED IN MG/24 HOURS) 21 mg/24hr patch Place 21 mg onto the skin daily. Reported on 04/08/2015    . ondansetron (ZOFRAN) 8 MG tablet Take 1 tablet (8 mg total) by mouth 2 (two) times daily as needed. Start  on the third day after chemotherapy. 30 tablet 1  . pantoprazole (PROTONIX) 20 MG tablet Take 1 tablet (20 mg total) by mouth daily. 14 tablet 0  . prazosin (MINIPRESS) 1 MG capsule Take 1 mg by mouth daily as needed (nightmares).    . prochlorperazine (COMPAZINE) 10 MG tablet Take 10 mg by mouth every 6 (six) hours.     No current facility-administered medications for this visit.    PHYSICAL EXAMINATION: ECOG PERFORMANCE STATUS: 1 - Symptomatic but completely ambulatory  Filed Vitals:   05/06/15 1127  BP: 108/68  Pulse: 99  Temp: 98.4 F (36.9 C)  Resp: 18   Filed Weights   05/06/15 1127  Weight: 106 lb 14.4 oz (48.49 kg)    GENERAL:alert, no distress and comfortable SKIN: skin color, texture, turgor are normal, no rashes or significant lesions EYES: normal, Conjunctiva are pink and non-injected, sclera clear OROPHARYNX:no exudate, no erythema and lips, buccal mucosa, and tongue normal  NECK: supple, thyroid normal size, non-tender, without nodularity LYMPH:  no palpable lymphadenopathy in the cervical, axillary or inguinal LUNGS: clear to auscultation and percussion with normal breathing effort HEART: regular rate & rhythm and no murmurs and no lower extremity edema ABDOMEN:abdomen soft, non-tender and normal bowel sounds MUSCULOSKELETAL:no cyanosis of digits and no clubbing  NEURO: alert & oriented x 3 with fluent speech, no focal motor/sensory deficits EXTREMITIES: No lower extremity edema  LABORATORY DATA:  I have reviewed the data as listed   Chemistry      Component Value Date/Time   NA 141 04/22/2015 0927   K 4.1 04/22/2015 0927   CO2 25 04/22/2015 0927   BUN 8.6 04/22/2015 0927   CREATININE 1.0 04/22/2015 0927      Component Value Date/Time   CALCIUM 9.4 04/22/2015 0927   ALKPHOS 83 04/22/2015 0927   AST 16 04/22/2015 0927   ALT 10 04/22/2015 0927   BILITOT <0.30 04/22/2015 0927       Lab Results  Component Value Date   WBC 8.9 05/06/2015   HGB  12.4 05/06/2015   HCT 38.5 05/06/2015   MCV 81.0 05/06/2015   PLT 373 05/06/2015   NEUTROABS 7.1* 05/06/2015   ASSESSMENT & PLAN:  Breast cancer of upper-outer quadrant of right female breast ([place]) Rt Lumpectomy 02/16/15: IDC grade 3, 1.7 cm, with HG DCIS, 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  Plan: 1. Adjuvant chemo with Dose dense AC followed by Abraxane weekly X 12 2. Foll by Adj XRT 3. Foll by Adj Tamoxifen vs anastrozole (after oophorectomy) ------------------------------------------------------------------------------------------------------------------------------------------------- Current Treatment: Cycle 4 day 1 dose dense Adriamycin and Cytoxan  Chemotherapy Side Effects/Toxicities: 1. Nausea: controlled with zofran and compazine 2. Constipation: advised to begin a stool softener BID and miralax daily PRN 3. Neulasta related bone pain: claritin daily x 5 days starting on day of injection. norco PRN for severe pain 4. Fatigue.  5. Pain in bilateral feet with mild tingling of the hands: I decreased the dosage of Cytoxan.  The labs were reviewed in detail and were  entirely stable. RTC in 2 weeks for cycle 1 of Abraxane.   she may plan to move her care to [facility]. If that is the case and we will transfer her care to Dr. Luciana Axe who had seen her previously.  No orders of the defined types were placed in this encounter.   The patient has a good understanding of the overall plan. she agrees with it. she will call with any problems that may develop before the next visit here.   Rulon Eisenmenger, MD 05/06/2015

## 2015-05-06 NOTE — Telephone Encounter (Signed)
appt made and avs printed °

## 2015-05-06 NOTE — Assessment & Plan Note (Signed)
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  Plan: 1. Adjuvant chemo with Dose dense AC followed by Abraxane weekly X 12 2. Foll by Adj XRT 3. Foll by Adj Tamoxifen vs anastrozole (after oophorectomy)  Current Treatment: Cycle 4 day 1 dose dense Adriamycin and Cytoxan  Chemotherapy Side Effects/Toxicities: 1. Nausea: controlled with zofran and compazine 2. Constipation: advised to begin a stool softener BID and miralax daily PRN 3. Neulasta related bone pain: claritin daily x 5 days starting on day of injection. norco PRN for severe pain 4. Fatigue.  5. Pain in bilateral feet with mild tingling of the hands: I decreased the dosage of Cytoxan.  The labs were reviewed in detail and were entirely stable. RTC in 2 weeks for cycle 1 of Abraxane.

## 2015-05-07 ENCOUNTER — Ambulatory Visit: Payer: Medicaid Other

## 2015-05-07 NOTE — Progress Notes (Signed)
Patient ID: Alexandria Richards, female   DOB: 15-Jun-1975, 40 y.o.   MRN: [id number] Oncologist: Dr. [name]  40 yo with newly diagnosed breast cancer presents for cardio-oncology clinic evaluation prior to starting Adriamycin-containing chemotherapy regimen. Right breast cancer was diagnosed 2/17, lumpectomy 02/16/15.  ER+/PR+/HER2-.  Plan for adjuvant chemotherapy with Adriamycin/Cytoxan then Abraxane weekly x 12 cycles.  This will be followed by radiation.   She is now about to start the last doxorubicin/Cytoxan cycle.    No exertional dyspnea or chest pain.  No palpitations, syncope, or lightheadedness.   PMH: 1. Breast cancer: Diagnosed 2/17, lumpectomy 02/16/15.  ER+/PR+/HER2-.  Plan for adjuvant chemotherapy with Adriamycin/Cytoxan then Abraxane weekly x 12 cycles.  This will be followed by radiation.  - Echo (3/17) with EF 56-38%, normal diastolic function, normal RV size and systolic function, lateral s' 14.9, GLS -22.2%.  - Echo (4/17) with EF 75-64%, normal diastolic function, lateral s' 13.2, GLS -20.5%.    Social History   Social History  . Marital Status: Single    Spouse Name: N/A  . Number of Children: 3  . Years of Education: N/A   Occupational History  . Not on file.   Social History Main Topics  . Smoking status: Former Research scientist (life sciences)  . Smokeless tobacco: Former Systems developer    Quit date: 01/15/2015  . Alcohol Use: Yes     Comment: Occassionally  . Drug Use: Yes    Special: Marijuana     Comment: last smoked marijuana 02/05/2015  . Sexual Activity: Yes    Birth Control/ Protection: None, Condom     Comment: states last Depo 12/2014   Other Topics Concern  . Not on file   Social History Narrative   Family History  Problem Relation Age of Onset  . Breast cancer Maternal Aunt     dx over 27  . Autism Brother     paternal 1/2 brother   ROS: All systems reviewed and negative except as per HPI.   Current Outpatient Prescriptions  Medication Sig Dispense Refill  . ALPRAZolam  (XANAX) 0.25 MG tablet Take 0.25 mg by mouth at bedtime as needed for anxiety. Reported on 04/01/2015    . dexamethasone (DECADRON) 4 MG tablet Take 1 tablet (4 mg total) by mouth daily. Take 1 tablets once a day on the day after chemo and then take 1 tablet once daily for 2 days 30 tablet 1  . HYDROcodone-acetaminophen (NORCO) 5-325 MG tablet Take 1 tablet by mouth every 6 (six) hours as needed for moderate pain or severe pain. 30 tablet 0  . ibuprofen (ADVIL,MOTRIN) 200 MG tablet Take 200 mg by mouth every 6 (six) hours as needed for fever or headache. Reported on 04/08/2015    . lidocaine-prilocaine (EMLA) cream Apply to affected area once 30 g 3  . nicotine (NICODERM CQ - DOSED IN MG/24 HOURS) 21 mg/24hr patch Place 21 mg onto the skin daily. Reported on 04/08/2015    . ondansetron (ZOFRAN) 8 MG tablet Take 1 tablet (8 mg total) by mouth 2 (two) times daily as needed. Start on the third day after chemotherapy. 30 tablet 1  . pantoprazole (PROTONIX) 20 MG tablet Take 1 tablet (20 mg total) by mouth daily. 14 tablet 0  . prazosin (MINIPRESS) 1 MG capsule Take 1 mg by mouth daily as needed (nightmares).    . prochlorperazine (COMPAZINE) 10 MG tablet Take 10 mg by mouth every 6 (six) hours.     No current facility-administered medications for  this encounter.   BP 102/64 mmHg  Pulse 73  Wt 110 lb 4 oz (50.009 kg)  SpO2 98% General: NAD Neck: No JVD, no thyromegaly or thyroid nodule.  Lungs: Clear to auscultation bilaterally with normal respiratory effort. CV: Nondisplaced PMI.  Heart regular S1/S2, no S3/S4, no murmur.  No peripheral edema.  No carotid bruit.  Normal pedal pulses.  Abdomen: Soft, nontender, no hepatosplenomegaly, no distention.  Skin: Intact without lesions or rashes.  Neurologic: Alert and oriented x 3.  Psych: Normal affect. Extremities: No clubbing or cyanosis.  HEENT: Normal.   Assessment/Plan: Patient is getting ready to start her 4th and final cycle of doxorubicin and  Cytoxan.  Echo today showed normal EF and strain pattern.   - She will continue current therapeutic plan.   - I will get an echo in 6 months.    Loralie Champagne 05/07/2015

## 2015-05-19 ENCOUNTER — Other Ambulatory Visit: Payer: Self-pay | Admitting: Hematology and Oncology

## 2015-05-19 NOTE — Assessment & Plan Note (Signed)
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  Plan: 1. Adjuvant chemo with Dose dense AC followed by Abraxane weekly X 12 2. Foll by Adj XRT 3. Foll by Adj Tamoxifen vs anastrozole (after oophorectomy) ------------------------------------------------------------------------------------------------------------------------------------------------- Current Treatment: completed 4 cycles of dose dense Adriamycin and Cytoxan, today is cycle 1/12 Abraxane  Chemotherapy Side Effects/Toxicities: 1. Nausea: controlled with zofran and compazine 2. Constipation: advised to begin a stool softener BID and miralax daily PRN 3. Neulasta related bone pain: claritin daily x 5 days starting on day of injection. norco PRN for severe pain 4. Fatigue.  5. Pain in bilateral feet with mild tingling of the hands: I decreased the dosage of Cytoxan.  The labs were reviewed in detail and were entirely stable. RTC in 2 weeks for cycle 3 of Abraxane.  she may plan to move her care to [facility]. If that is the case and we will transfer her care to Dr. Luciana Axe who had seen her previously.

## 2015-05-20 ENCOUNTER — Ambulatory Visit (HOSPITAL_BASED_OUTPATIENT_CLINIC_OR_DEPARTMENT_OTHER): Payer: Medicaid Other | Admitting: Hematology and Oncology

## 2015-05-20 ENCOUNTER — Encounter: Payer: Self-pay | Admitting: Hematology and Oncology

## 2015-05-20 ENCOUNTER — Ambulatory Visit (HOSPITAL_BASED_OUTPATIENT_CLINIC_OR_DEPARTMENT_OTHER): Payer: Medicaid Other

## 2015-05-20 ENCOUNTER — Other Ambulatory Visit: Payer: Self-pay | Admitting: *Deleted

## 2015-05-20 ENCOUNTER — Other Ambulatory Visit (HOSPITAL_BASED_OUTPATIENT_CLINIC_OR_DEPARTMENT_OTHER): Payer: Medicaid Other

## 2015-05-20 ENCOUNTER — Encounter: Payer: Self-pay | Admitting: *Deleted

## 2015-05-20 VITALS — BP 108/72 | HR 88 | Temp 98.7°F | Resp 18 | Ht 62.0 in | Wt 107.5 lb

## 2015-05-20 DIAGNOSIS — C50411 Malignant neoplasm of upper-outer quadrant of right female breast: Secondary | ICD-10-CM

## 2015-05-20 DIAGNOSIS — C50919 Malignant neoplasm of unspecified site of unspecified female breast: Secondary | ICD-10-CM

## 2015-05-20 DIAGNOSIS — R5383 Other fatigue: Secondary | ICD-10-CM

## 2015-05-20 DIAGNOSIS — Z5111 Encounter for antineoplastic chemotherapy: Secondary | ICD-10-CM | POA: Diagnosis not present

## 2015-05-20 DIAGNOSIS — Z1509 Genetic susceptibility to other malignant neoplasm: Secondary | ICD-10-CM

## 2015-05-20 DIAGNOSIS — Z1502 Genetic susceptibility to malignant neoplasm of ovary: Secondary | ICD-10-CM

## 2015-05-20 DIAGNOSIS — Z1589 Genetic susceptibility to other disease: Secondary | ICD-10-CM

## 2015-05-20 LAB — COMPREHENSIVE METABOLIC PANEL
ALBUMIN: 3.6 g/dL (ref 3.5–5.0)
ALK PHOS: 90 U/L (ref 40–150)
ALT: 10 U/L (ref 0–55)
AST: 14 U/L (ref 5–34)
Anion Gap: 9 mEq/L (ref 3–11)
BUN: 6.4 mg/dL — AB (ref 7.0–26.0)
CALCIUM: 9.6 mg/dL (ref 8.4–10.4)
CO2: 25 mEq/L (ref 22–29)
Chloride: 107 mEq/L (ref 98–109)
Creatinine: 0.8 mg/dL (ref 0.6–1.1)
EGFR: >90 mL/min/{1.73_m2} (ref >90)
Glucose: 97 mg/dl (ref 70–140)
POTASSIUM: 3.4 meq/L — AB (ref 3.5–5.1)
Sodium: 141 mEq/L (ref 136–145)
Total Protein: 7.6 g/dL (ref 6.4–8.3)

## 2015-05-20 LAB — CBC WITH DIFFERENTIAL/PLATELET
BASO%: 0.5 % (ref 0.0–2.0)
BASOS ABS: 0.1 10*3/uL (ref 0.0–0.1)
EOS ABS: 0 10*3/uL (ref 0.0–0.5)
EOS%: 0.1 % (ref 0.0–7.0)
HEMATOCRIT: 37.3 % (ref 34.8–46.6)
HEMOGLOBIN: 12.1 g/dL (ref 11.6–15.9)
LYMPH#: 1.2 10*3/uL (ref 0.9–3.3)
LYMPH%: 11.2 % — ABNORMAL LOW (ref 14.0–49.7)
MCH: 26.7 pg (ref 25.1–34.0)
MCHC: 32.4 g/dL (ref 31.5–36.0)
MCV: 82.3 fL (ref 79.5–101.0)
MONO#: 0.4 10*3/uL (ref 0.1–0.9)
MONO%: 3.5 % (ref 0.0–14.0)
NEUT#: 9.4 10*3/uL — ABNORMAL HIGH (ref 1.5–6.5)
NEUT%: 84.7 % — ABNORMAL HIGH (ref 38.4–76.8)
Platelets: 278 10*3/uL (ref 145–400)
RBC: 4.53 10*6/uL (ref 3.70–5.45)
RDW: 15.7 % — AB (ref 11.2–14.5)
WBC: 11.1 10*3/uL — ABNORMAL HIGH (ref 3.9–10.3)

## 2015-05-20 MED ORDER — PROCHLORPERAZINE MALEATE 10 MG PO TABS
ORAL_TABLET | ORAL | Status: AC
  Filled 2015-05-20: qty 1

## 2015-05-20 MED ORDER — SODIUM CHLORIDE 0.9 % IV SOLN
Freq: Once | INTRAVENOUS | Status: AC
  Administered 2015-05-20: 11:00:00 via INTRAVENOUS

## 2015-05-20 MED ORDER — PROCHLORPERAZINE MALEATE 10 MG PO TABS
10.0000 mg | ORAL_TABLET | Freq: Once | ORAL | Status: AC
  Administered 2015-05-20: 10 mg via ORAL

## 2015-05-20 MED ORDER — SODIUM CHLORIDE 0.9% FLUSH
10.0000 mL | INTRAVENOUS | Status: DC | PRN
  Administered 2015-05-20: 10 mL
  Filled 2015-05-20: qty 10

## 2015-05-20 MED ORDER — HEPARIN SOD (PORK) LOCK FLUSH 100 UNIT/ML IV SOLN
500.0000 [IU] | Freq: Once | INTRAVENOUS | Status: AC | PRN
  Administered 2015-05-20: 500 [IU]
  Filled 2015-05-20: qty 5

## 2015-05-20 MED ORDER — PACLITAXEL PROTEIN-BOUND CHEMO INJECTION 100 MG
80.0000 mg/m2 | Freq: Once | INTRAVENOUS | Status: AC
  Administered 2015-05-20: 125 mg via INTRAVENOUS
  Filled 2015-05-20: qty 25

## 2015-05-20 NOTE — Progress Notes (Signed)
Patient Care Team: Kerin Perna, NP as PCP - General (Internal Medicine) Fanny Skates, MD as Consulting Physician (General Surgery) Nicholas Lose, MD as Consulting Physician (Hematology and Oncology) Kyung Rudd, MD as Consulting Physician (Radiation Oncology) Sylvan Cheese, NP as Nurse Practitioner (Hematology and Oncology)  DIAGNOSIS: Breast cancer of upper-outer quadrant of right female breast [facility])   Staging form: Breast, AJCC 7th Edition     Clinical stage from 01/19/2015: Stage IA (T1c, N0, M0) - Unsigned       Staging comments: Staged at breast conference on 1.4.17  SUMMARY OF ONCOLOGIC HISTORY:   Breast cancer of upper-outer quadrant of right female breast ([place])   12/31/2014 Mammogram Right breast over the palpable mass at 10:30 position: 1.5 x 1.1 x 1.3 cm with multiple abnormal appearing axillary lymph nodes largest 1.6 cm   01/05/2015 Initial Diagnosis Right breast biopsy: IDC grade 2 with DCIS, right axillary lymph node biopsy negative, ER 100%, PR 40%, HER-2 negative, Ki-67 90%   02/16/2015 Surgery Rt Lumpectomy: IDC grade 3, 1.7 cm, with HG DCIS, 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 After the last cycle of treatment, she did not have as much nausea. She does have continued fatigue. Denies any abdominal pain. She had constipation which is now improved.  REVIEW OF SYSTEMS:   Constitutional: Denies fevers, chills or abnormal weight loss Eyes: Denies blurriness of vision Ears, nose, mouth, throat, and face: Denies  mucositis or sore throat Respiratory: Denies cough, dyspnea or wheezes Cardiovascular: Denies palpitation, chest discomfort Gastrointestinal:  Denies nausea, heartburn or change in bowel habits Skin: Discoloration of the skin of her extremities related to chemotherapy Lymphatics: Denies new lymphadenopathy or easy bruising Neurological:Denies numbness, tingling or new weaknesses Behavioral/Psych: Mood is stable, no new changes  Extremities: No lower extremity edema Breast:  denies any pain or lumps or nodules in either breasts All other systems were reviewed with the patient and are negative.  I have reviewed the past medical history, past surgical history, social history and family history with the patient and they are unchanged from previous note.  ALLERGIES:  has No Known Allergies.  MEDICATIONS:  Current Outpatient Prescriptions  Medication Sig Dispense Refill  . ALPRAZolam (XANAX) 0.25 MG tablet Take 0.25 mg by mouth at bedtime as needed for anxiety. Reported on 04/01/2015    . dexamethasone (DECADRON) 4 MG tablet Take 1 tablet (4 mg total) by mouth daily. Take 1 tablets once a day on the day after chemo and then take 1 tablet once daily for 2 days 30 tablet 1  . HYDROcodone-acetaminophen (NORCO) 5-325 MG tablet Take 1 tablet by mouth every 6 (six) hours as needed for moderate pain or severe pain. 30 tablet 0  . ibuprofen (ADVIL,MOTRIN) 200 MG tablet Take 200 mg by mouth every 6 (six) hours as needed for fever or headache. Reported on 04/08/2015    . lidocaine-prilocaine (EMLA) cream Apply to affected area once 30 g 3  . nicotine (NICODERM CQ - DOSED IN MG/24 HOURS) 21 mg/24hr patch Place 21 mg onto  the skin daily. Reported on 04/08/2015    . ondansetron (ZOFRAN) 8 MG tablet Take 1 tablet (8 mg total) by mouth 2 (two) times daily as needed. Start on the third day after chemotherapy. 30 tablet 1  . pantoprazole (PROTONIX) 20 MG tablet Take 1 tablet (20 mg total) by mouth daily. 14 tablet 0   . prazosin (MINIPRESS) 1 MG capsule Take 1 mg by mouth daily as needed (nightmares).    . prochlorperazine (COMPAZINE) 10 MG tablet Take 10 mg by mouth every 6 (six) hours.     No current facility-administered medications for this visit.    PHYSICAL EXAMINATION: ECOG PERFORMANCE STATUS: 1 - Symptomatic but completely ambulatory  Filed Vitals:   05/20/15 1005  BP: 108/72  Pulse: 88  Temp: 98.7 F (37.1 C)  Resp: 18   Filed Weights   05/20/15 1005  Weight: 107 lb 8 oz (48.762 kg)    GENERAL:alert, no distress and comfortable SKIN: skin color, texture, turgor are normal, no rashes or significant lesions EYES: normal, Conjunctiva are pink and non-injected, sclera clear OROPHARYNX:no exudate, no erythema and lips, buccal mucosa, and tongue normal  NECK: supple, thyroid normal size, non-tender, without nodularity LYMPH:  no palpable lymphadenopathy in the cervical, axillary or inguinal LUNGS: clear to auscultation and percussion with normal breathing effort HEART: regular rate & rhythm and no murmurs and no lower extremity edema ABDOMEN:abdomen soft, non-tender and normal bowel sounds MUSCULOSKELETAL:no cyanosis of digits and no clubbing  NEURO: alert & oriented x 3 with fluent speech, no focal motor/sensory deficits EXTREMITIES: No lower extremity edema  LABORATORY DATA:  I have reviewed the data as listed   Chemistry      Component Value Date/Time   NA 142 05/06/2015 1113   K 3.3* 05/06/2015 1113   CO2 23 05/06/2015 1113   BUN 6.6* 05/06/2015 1113   CREATININE 0.9 05/06/2015 1113      Component Value Date/Time   CALCIUM 9.5 05/06/2015 1113   ALKPHOS 91 05/06/2015 1113   AST 17 05/06/2015 1113   ALT 12 05/06/2015 1113   BILITOT <0.30 05/06/2015 1113       Lab Results  Component Value Date   WBC 11.1* 05/20/2015   HGB 12.1 05/20/2015   HCT 37.3 05/20/2015   MCV 82.3 05/20/2015   PLT 278 05/20/2015   NEUTROABS 9.4* 05/20/2015     ASSESSMENT & PLAN:    Breast cancer of upper-outer quadrant of right female breast ([place]) Rt Lumpectomy 02/16/15: IDC grade 3, 1.7 cm, with HG DCIS, 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  Plan: 1. Adjuvant chemo with Dose dense AC followed by Abraxane weekly X 12 2. Foll by Adj XRT 3. Foll by Adj Tamoxifen vs anastrozole (after oophorectomy) ------------------------------------------------------------------------------------------------------------------------------------------------- Current Treatment: completed 4 cycles of dose dense Adriamycin and Cytoxan, today is cycle 1/12 Abraxane  Chemotherapy Side Effects/Toxicities: 1. Nausea: controlled with zofran and compazine 2. Constipation: On stool softener BID and miralax daily PRN 3. Neulasta related bone pain: claritin daily x 5 days starting on day of injection. norco PRN for severe pain 4. Fatigue.  5. Pain in bilateral feet with mild tingling of the hands: we decreased the dosage of Cytoxan.  The labs were reviewed in detail and were entirely stable. She has moved to [place]. She will be seen by Dr. Luciana Axe who had seen her previously. I  will call Dr. Luciana Axe and hand over her care to him.  No orders of the defined types were placed in this encounter.   The patient has a good understanding of the overall plan. she agrees with it. she will call with any problems that may develop before the next visit here.   Rulon Eisenmenger, MD 05/20/2015

## 2015-05-20 NOTE — Patient Instructions (Signed)
[place] Discharge Instructions for Patients Receiving Chemotherapy  Today you received the following chemotherapy agents Abraxane.  To help prevent nausea and vomiting after your treatment, we encourage you to take your nausea medication as prescribed.   If you develop nausea and vomiting that is not controlled by your nausea medication, call the clinic.   BELOW ARE SYMPTOMS THAT SHOULD BE REPORTED IMMEDIATELY:  *FEVER GREATER THAN 100.5 F  *CHILLS WITH OR WITHOUT FEVER  NAUSEA AND VOMITING THAT IS NOT CONTROLLED WITH YOUR NAUSEA MEDICATION  *UNUSUAL SHORTNESS OF BREATH  *UNUSUAL BRUISING OR BLEEDING  TENDERNESS IN MOUTH AND THROAT WITH OR WITHOUT PRESENCE OF ULCERS  *URINARY PROBLEMS  *BOWEL PROBLEMS  UNUSUAL RASH Items with * indicate a potential emergency and should be followed up as soon as possible.  Feel free to call the clinic you have any questions or concerns. The clinic phone number is [phone].  Please show the [place] at check-in to the Emergency Department and triage nurse. Nanoparticle Albumin-Bound Paclitaxel injection What is this medicine? NANOPARTICLE ALBUMIN-BOUND PACLITAXEL (Na no PAHR ti kuhl al BYOO muhn-bound PAK li TAX el) is a chemotherapy drug. It targets fast dividing cells, like cancer cells, and causes these cells to die. This medicine is used to treat advanced breast cancer and advanced lung cancer. This medicine may be used for other purposes; ask your health care provider or pharmacist if you have questions. What should I tell my health care provider before I take this medicine? They need to know if you have any of these conditions: -kidney disease -liver disease -low blood counts, like low platelets, red blood cells, or white blood cells -recent or ongoing radiation therapy -an unusual or allergic reaction to paclitaxel, albumin, other chemotherapy, other medicines, foods, dyes, or preservatives -pregnant or  trying to get pregnant -breast-feeding How should I use this medicine? This drug is given as an infusion into a vein. It is administered in a hospital or clinic by a specially trained health care professional. Talk to your pediatrician regarding the use of this medicine in children. Special care may be needed. Overdosage: If you think you have taken too much of this medicine contact a poison control center or emergency room at once. NOTE: This medicine is only for you. Do not share this medicine with others. What if I miss a dose? It is important not to miss your dose. Call your doctor or health care professional if you are unable to keep an appointment. What may interact with this medicine? -cyclosporine -diazepam -ketoconazole -medicines to increase blood counts like filgrastim, pegfilgrastim, sargramostim -other chemotherapy drugs like cisplatin, doxorubicin, epirubicin, etoposide, teniposide, vincristine -quinidine -testosterone -vaccines -verapamil Talk to your doctor or health care professional before taking any of these medicines: -acetaminophen -aspirin -ibuprofen -ketoprofen -naproxen This list may not describe all possible interactions. Give your health care provider a list of all the medicines, herbs, non-prescription drugs, or dietary supplements you use. Also tell them if you smoke, drink alcohol, or use illegal drugs. Some items may interact with your medicine. What should I watch for while using this medicine? Your condition will be monitored carefully while you are receiving this medicine. You will need important blood work done while you are taking this medicine. This drug may make you feel generally unwell. This is not uncommon, as chemotherapy can affect healthy cells as well as cancer cells. Report any side effects. Continue your course of treatment even though you feel ill unless  your doctor tells you to stop. In some cases, you may be given additional medicines to  help with side effects. Follow all directions for their use. Call your doctor or health care professional for advice if you get a fever, chills or sore throat, or other symptoms of a cold or flu. Do not treat yourself. This drug decreases your body's ability to fight infections. Try to avoid being around people who are sick. This medicine may increase your risk to bruise or bleed. Call your doctor or health care professional if you notice any unusual bleeding. Be careful brushing and flossing your teeth or using a toothpick because you may get an infection or bleed more easily. If you have any dental work done, tell your dentist you are receiving this medicine. Avoid taking products that contain aspirin, acetaminophen, ibuprofen, naproxen, or ketoprofen unless instructed by your doctor. These medicines may hide a fever. Do not become pregnant while taking this medicine. Women should inform their doctor if they wish to become pregnant or think they might be pregnant. There is a potential for serious side effects to an unborn child. Talk to your health care professional or pharmacist for more information. Do not breast-feed an infant while taking this medicine. Men are advised not to father a child while receiving this medicine. What side effects may I notice from receiving this medicine? Side effects that you should report to your doctor or health care professional as soon as possible: -allergic reactions like skin rash, itching or hives, swelling of the face, lips, or tongue -low blood counts - This drug may decrease the number of white blood cells, red blood cells and platelets. You may be at increased risk for infections and bleeding. -signs of infection - fever or chills, cough, sore throat, pain or difficulty passing urine -signs of decreased platelets or bleeding - bruising, pinpoint red spots on the skin, black, tarry stools, nosebleeds -signs of decreased red blood cells - unusually weak or  tired, fainting spells, lightheadedness -breathing problems -changes in vision -chest pain -high or low blood pressure -mouth sores -nausea and vomiting -pain, swelling, redness or irritation at the injection site -pain, tingling, numbness in the hands or feet -slow or irregular heartbeat -swelling of the ankle, feet, hands Side effects that usually do not require medical attention (report to your doctor or health care professional if they continue or are bothersome): -aches, pains -changes in the color of fingernails -diarrhea -hair loss -loss of appetite This list may not describe all possible side effects. Call your doctor for medical advice about side effects. You may report side effects to FDA at 1-800-FDA-1088. Where should I keep my medicine? This drug is given in a hospital or clinic and will not be stored at home. NOTE: This sheet is a summary. It may not cover all possible information. If you have questions about this medicine, talk to your doctor, pharmacist, or health care provider.    2016, Elsevier/Gold Standard. (2012-02-25 16:48:50)

## 2015-05-20 NOTE — Progress Notes (Signed)
Pt has moved to [place] and will receive remainder of her treatment with Dr. Luciana Axe. Request sent to HIM for all MR to be faxed to [phone]

## 2015-05-23 ENCOUNTER — Telehealth: Payer: Self-pay

## 2015-05-23 NOTE — Telephone Encounter (Signed)
-----   Message from Flo Shanks, RN sent at 05/20/2015  1:28 PM EDT ----- Regarding: Chemo follow up call/ Dr. [name] First time Abraxane.

## 2015-05-23 NOTE — Telephone Encounter (Signed)
Called [facility] for chemotherapy F/U.  Patient is doing well.  Denies vomiting, nausea is controlled.  Pt complains of achiness, but is also controlled.  Denies any new side effects or symptoms.  Bowel and bladder is functioning well.  Eating and drinking well and I instructed to drink 64 oz minimum daily or at least the day before, of and after treatment.  Denies questions at this time and encouraged to call if needed.  Reviewed how to call after hours in the case of an emergency.

## 2015-05-25 ENCOUNTER — Telehealth: Payer: Self-pay | Admitting: *Deleted

## 2015-05-25 NOTE — Telephone Encounter (Signed)
FYI Call received from [facility] with [facility] in [place] office of Dr. Sharolyn Douglas.  Requesting patient's treatment information as she has moved to [place].  Made a self referral and would like to receive treatment there on Friday.  Says she cannot drive back and forth to [place].  Could you send her treatment records, last office note via faxed to [phone].  Not sure if we can arrange this with this short notice."  Information routed and faxed treatment plan flow sheet and treatment plan information.Marland Kitchen

## 2015-05-26 ENCOUNTER — Telehealth: Payer: Self-pay

## 2015-05-26 NOTE — Telephone Encounter (Signed)
Called pt in regards to switch to new oncologist in [place].  Pt received first treatment here but has since switched care to Dr. Luciana Axe.  Pt down for chemo treatment with [place] tomorrow and I wanted to confirm whether or not pt would be receiving that through [place] our through Dr. Merilyn Baba office.  Pt confirms that care has been transitioned to Dr. Merilyn Baba office and she has an appointment set up with them for her next treatment.  I will put in for all appointments to be cancelled here at [facility] unless otherwise noted.  Pt without further question or concern at time of call.

## 2015-05-27 ENCOUNTER — Ambulatory Visit: Payer: Medicaid Other

## 2015-05-27 ENCOUNTER — Other Ambulatory Visit: Payer: Medicaid Other

## 2015-06-01 ENCOUNTER — Telehealth: Payer: Self-pay | Admitting: Hematology and Oncology

## 2015-06-01 NOTE — Telephone Encounter (Signed)
Faxed pt medical records to Dr. Luciana Axe [phone]

## 2015-06-03 ENCOUNTER — Other Ambulatory Visit: Payer: Medicaid Other

## 2015-06-03 ENCOUNTER — Ambulatory Visit: Payer: Medicaid Other

## 2015-06-10 ENCOUNTER — Other Ambulatory Visit: Payer: Medicaid Other

## 2015-06-10 ENCOUNTER — Ambulatory Visit: Payer: Medicaid Other

## 2015-06-17 ENCOUNTER — Ambulatory Visit: Payer: Medicaid Other

## 2015-06-17 ENCOUNTER — Other Ambulatory Visit: Payer: Medicaid Other

## 2015-06-28 ENCOUNTER — Telehealth: Payer: Self-pay | Admitting: Radiation Oncology

## 2015-06-28 NOTE — Telephone Encounter (Signed)
The patient has relocated to [place], [place], and is receiving her chemotherapy currently with Dr. Luciana Axe. I called his office and spoke with his nurse Marliss Coots, and just wanted to confirm the new that we would still recommend radiotherapy, and if she was going to be pursuing her care there, that she would need to have a referral. Marliss Coots stated that she would be happy to coordinate this, and we would be happy to see the patient if she moved back to [place] and needed additional radiation treatment.

## 2015-07-18 ENCOUNTER — Other Ambulatory Visit: Payer: Self-pay | Admitting: Nurse Practitioner

## 2015-09-06 ENCOUNTER — Telehealth: Payer: Self-pay | Admitting: Hematology and Oncology

## 2015-09-06 NOTE — Telephone Encounter (Signed)
Faxed records to [facility] [phone]

## 2015-09-30 ENCOUNTER — Telehealth (HOSPITAL_COMMUNITY): Payer: Self-pay | Admitting: Vascular Surgery

## 2015-09-30 NOTE — Telephone Encounter (Signed)
The number in system is invalid .Marland Kitchen Will send pt a letter

## 2016-05-17 DIAGNOSIS — Z029 Encounter for administrative examinations, unspecified: Secondary | ICD-10-CM

## 2016-09-22 IMAGING — CT CT CHEST W/ CM
2 of 5 series · 15 of 36 positions shown, 18 images · IV contrast (APPLIED)
Comparison: MRI of the breast 01/25/2015

CLINICAL DATA: Lymphadenopathy seen on breast MRI. History of
recently diagnosed IDC of the right breast.

EXAM:
CT CHEST WITH CONTRAST
TECHNIQUE: Multidetector CT imaging of the chest was performed during
intravenous contrast administration.
CONTRAST:  75mL [id number] IOPAMIDOL ([id number]) INJECTION 61%

[Series 3: cor · coronal · 0.60mm/px · 3 of 65 slices shown]
[im 13/65  lung]
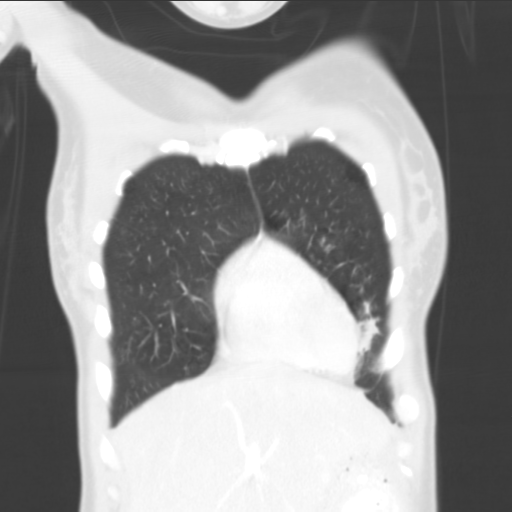
[im 26/65  lung]
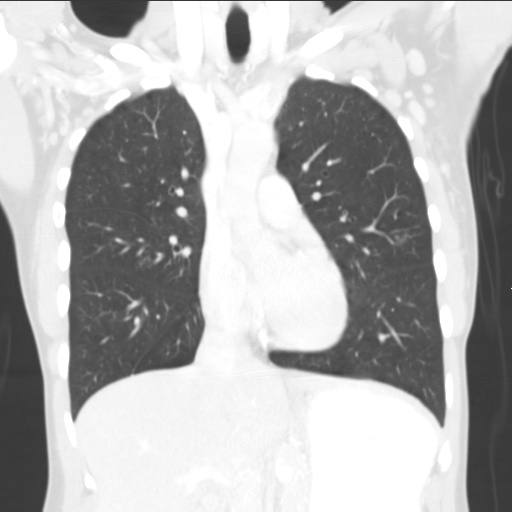
[im 39/65  lung]
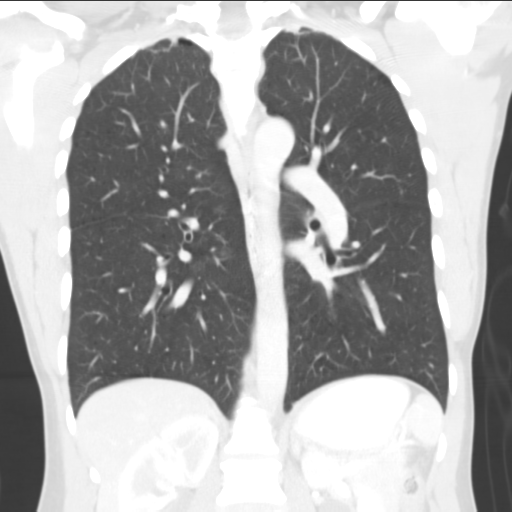

[Series 6: super d · axial · 0.54mm/px · z∈[-188,+84]mm · 12 of 306 slices shown, 15 images]
[im 17/306  mediastinal]
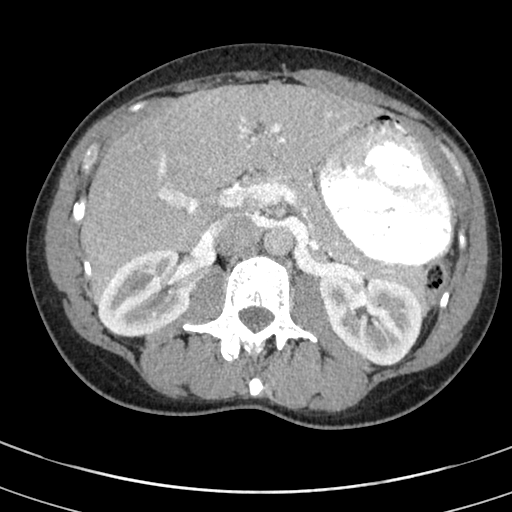
[im 17/306  lung]
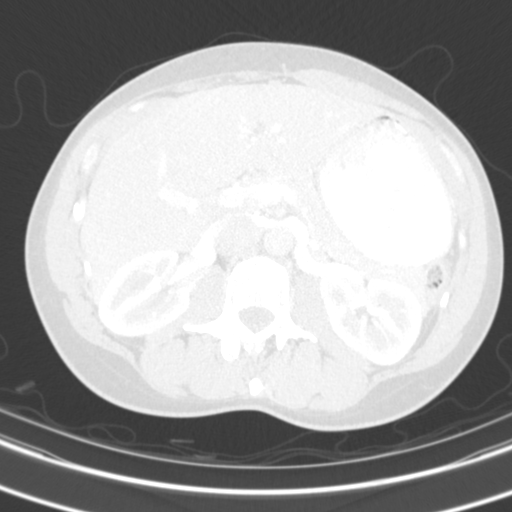
[im 49/306  lung]
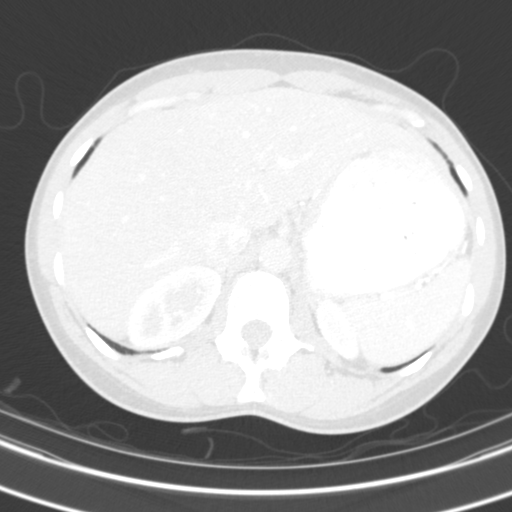
[im 65/306  lung]
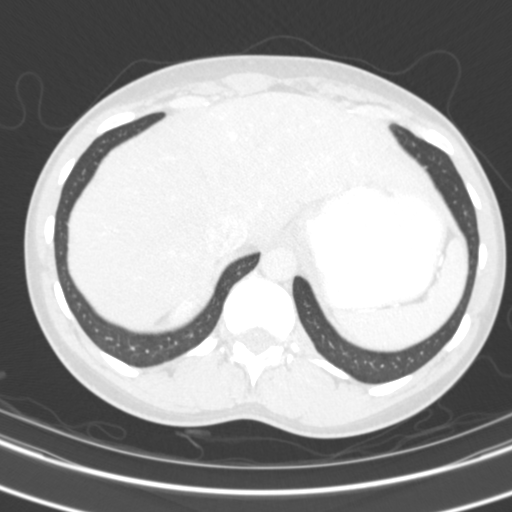
[im 97/306  lung]
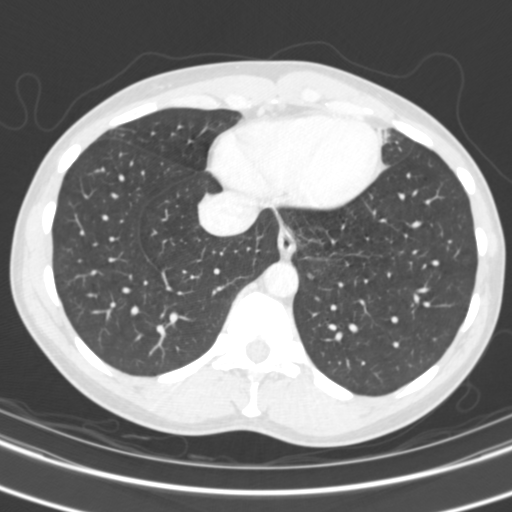
[im 113/306  mediastinal]
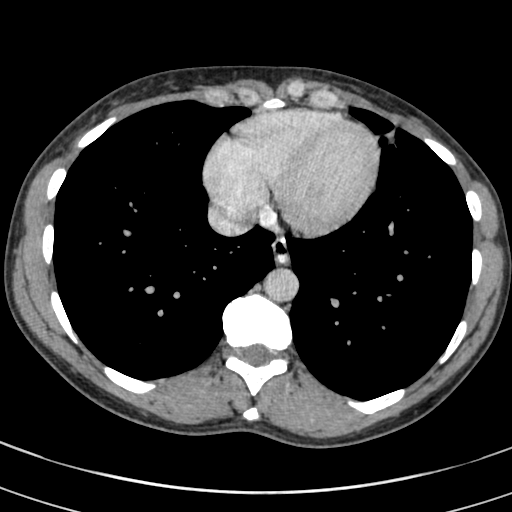
[im 113/306  lung]
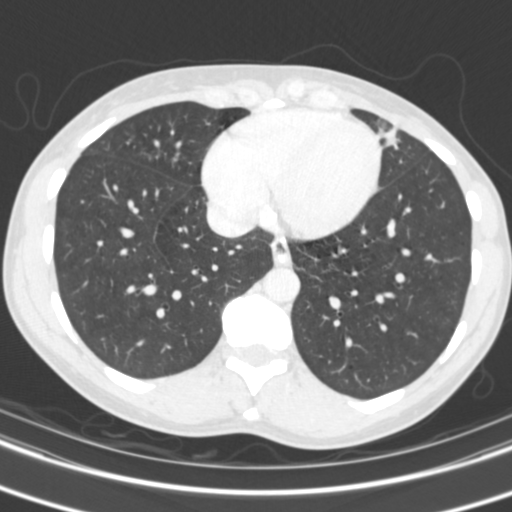
[im 145/306  lung]
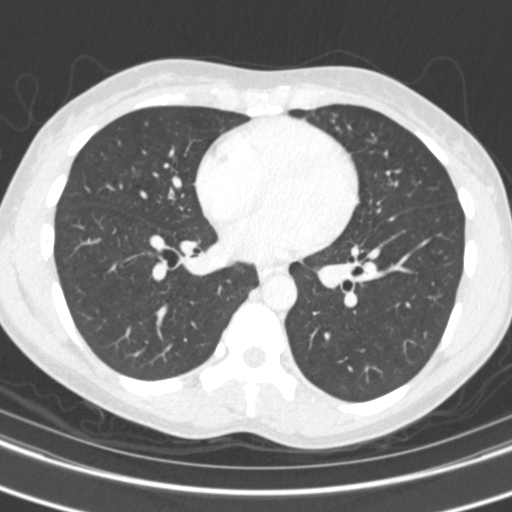
[im 161/306  lung]
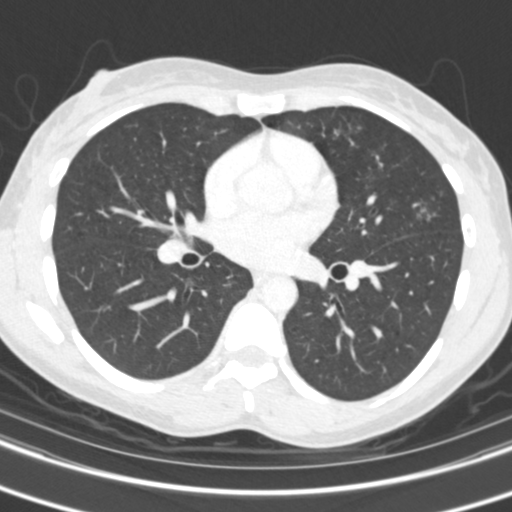
[im 193/306  lung]
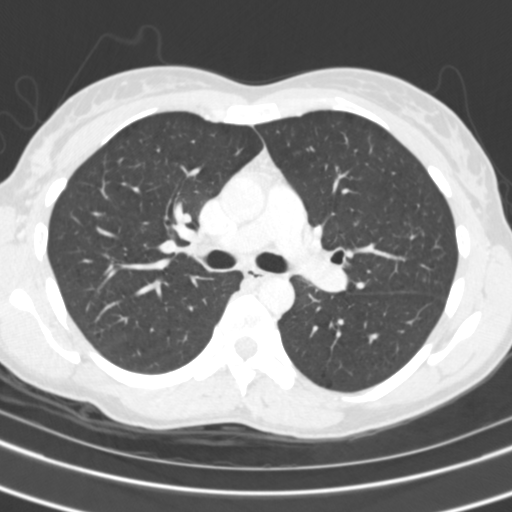
[im 209/306  mediastinal]
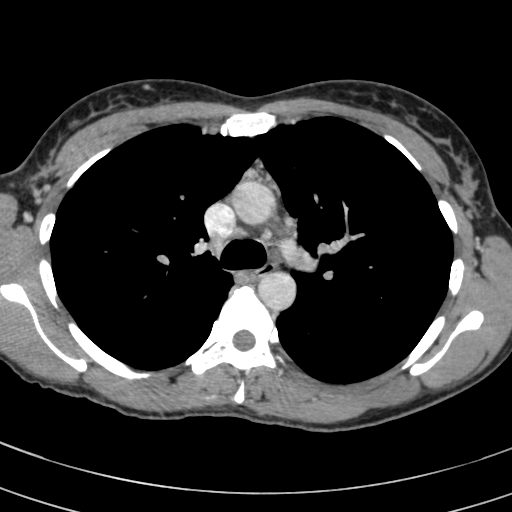
[im 209/306  lung]
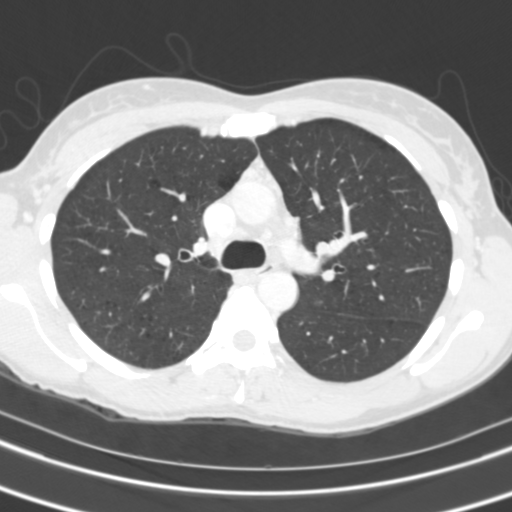
[im 241/306  lung]
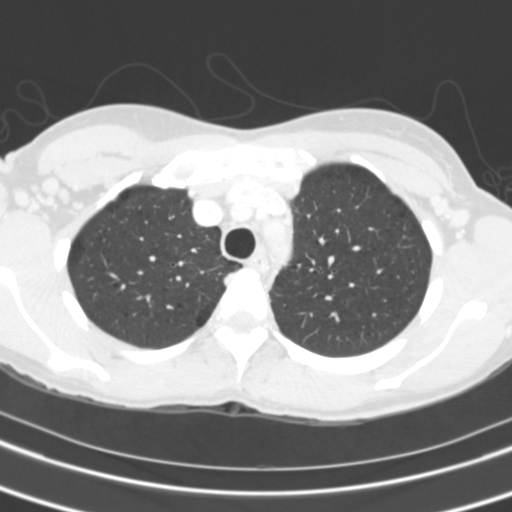
[im 257/306  lung]
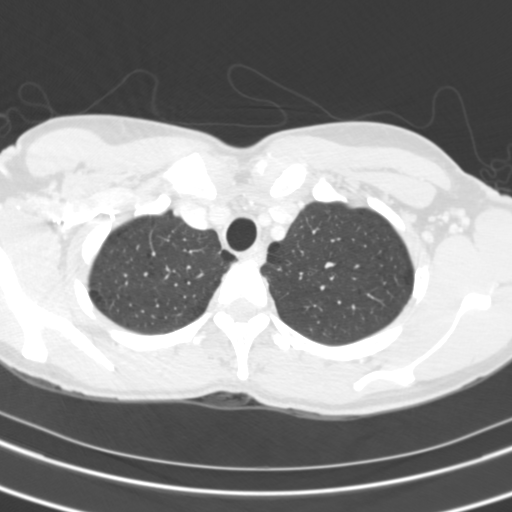
[im 289/306  lung]
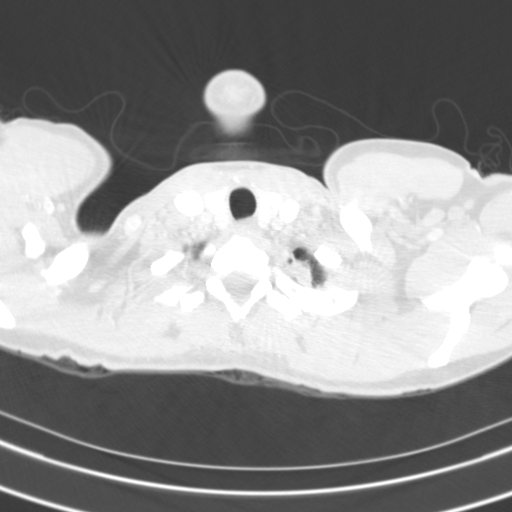

[15 of 36 positions shown; findings below may reference images not displayed]

FINDINGS: Mediastinum/Lymph Nodes: No masses, pathologically enlarged lymph
nodes, or other significant abnormality. There are bilateral
axillary moderately enlarged with diffuse cortical thickening lymph
nodes, more pronounced on the right. Some of these are in
subpectoral location. Shotty level 4 cervical lymph nodes are also
noted. Clip containing enhancing mass is seen in the axillary tail
of the right breast, likely representing the known breast
malignancy.

Lungs/Pleura: No suspicious pulmonary masses, pleural effusion or
pneumothorax. There is bilateral mild upper lobe predominant
paraseptal emphysema. Mild scarring is seen in the right more than
left apex. Small areas of patchy ground-glass opacities are seen in
the bilateral inferior lower lobes, lingula, and right middle lobe.

Upper abdomen: No acute findings.

Musculoskeletal: No chest wall mass or suspicious bone lesions
identified.
IMPRESSION: No evidence of mediastinal or hilar lymphadenopathy.

Borderline bilateral axillary lymphadenopathy.

Mild to moderate upper lobe predominant emphysematous changes and
apical scarring.

Small areas of ground-glass opacities within bilateral inferior
upper lobes, right middle lobe and lingula. The findings are
nonspecific but may be seen with atypical or viral pneumonia.

Known right breast malignancy.

## 2023-02-25 ENCOUNTER — Encounter: Payer: Self-pay | Admitting: Genetic Counselor

## 2023-03-20 ENCOUNTER — Encounter: Payer: Self-pay | Admitting: Genetic Counselor
